# Patient Record
Sex: Female | Born: 1985 | Race: Black or African American | Hispanic: No | Marital: Single | Smoking: Former smoker
Health system: Southern US, Community
[De-identification: ages and names within clinical notes are randomized; demographics above are authoritative.]

## PROBLEM LIST (undated history)

## (undated) ENCOUNTER — Ambulatory Visit (HOSPITAL_COMMUNITY): Admission: EM | Discharge status: Absent without leave | Payer: Medicaid Other

## (undated) DIAGNOSIS — I10 Essential (primary) hypertension: Secondary | ICD-10-CM

## (undated) DIAGNOSIS — K8020 Calculus of gallbladder without cholecystitis without obstruction: Secondary | ICD-10-CM

## (undated) HISTORY — PX: DENTAL SURGERY: SHX609

---

## 2015-08-23 ENCOUNTER — Encounter (HOSPITAL_COMMUNITY): Payer: Self-pay

## 2015-08-23 ENCOUNTER — Emergency Department (HOSPITAL_COMMUNITY)
Admission: EM | Admit: 2015-08-23 | Discharge: 2015-08-23 | Disposition: A | Discharge status: Home / Self Care | Payer: Medicaid Other | Attending: Emergency Medicine | Admitting: Emergency Medicine

## 2015-08-23 ENCOUNTER — Emergency Department (HOSPITAL_COMMUNITY): Payer: Medicaid Other

## 2015-08-23 ENCOUNTER — Emergency Department (HOSPITAL_COMMUNITY)
Admission: EM | Admit: 2015-08-23 | Discharge: 2015-08-23 | Discharge status: Absent without leave | Payer: Medicaid Other

## 2015-08-23 DIAGNOSIS — N390 Urinary tract infection, site not specified: Secondary | ICD-10-CM | POA: Diagnosis not present

## 2015-08-23 DIAGNOSIS — I10 Essential (primary) hypertension: Secondary | ICD-10-CM | POA: Insufficient documentation

## 2015-08-23 DIAGNOSIS — R109 Unspecified abdominal pain: Secondary | ICD-10-CM | POA: Diagnosis present

## 2015-08-23 DIAGNOSIS — Z79899 Other long term (current) drug therapy: Secondary | ICD-10-CM | POA: Insufficient documentation

## 2015-08-23 DIAGNOSIS — F17210 Nicotine dependence, cigarettes, uncomplicated: Secondary | ICD-10-CM | POA: Diagnosis not present

## 2015-08-23 DIAGNOSIS — N159 Renal tubulo-interstitial disease, unspecified: Secondary | ICD-10-CM

## 2015-08-23 HISTORY — DX: Essential (primary) hypertension: I10

## 2015-08-23 LAB — URINALYSIS, ROUTINE W REFLEX MICROSCOPIC
Bilirubin Urine: NEGATIVE
GLUCOSE, UA: NEGATIVE mg/dL
Ketones, ur: NEGATIVE mg/dL
Nitrite: NEGATIVE
PROTEIN: NEGATIVE mg/dL
Specific Gravity, Urine: 1.018 (ref 1.005–1.030)
pH: 7.5 (ref 5.0–8.0)

## 2015-08-23 LAB — URINE MICROSCOPIC-ADD ON

## 2015-08-23 LAB — POC URINE PREG, ED: PREG TEST UR: NEGATIVE

## 2015-08-23 MED ORDER — CEPHALEXIN 500 MG PO CAPS: 1000.0000 mg | ORAL_CAPSULE | Freq: Three times a day (TID) | ORAL | Status: DC

## 2015-08-23 MED ORDER — HYDROCODONE-ACETAMINOPHEN 5-325 MG PO TABS: 1.0000 | ORAL_TABLET | Freq: Four times a day (QID) | ORAL | Status: DC | PRN

## 2015-08-23 MED ORDER — KETOROLAC TROMETHAMINE 60 MG/2ML IM SOLN
60.0000 mg | Freq: Once | INTRAMUSCULAR | Status: AC
  Administered 2015-08-23: 60 mg via INTRAMUSCULAR
  Filled 2015-08-23: qty 2

## 2015-08-23 MED ORDER — HYDROCODONE-ACETAMINOPHEN 5-325 MG PO TABS
2.0000 | ORAL_TABLET | Freq: Once | ORAL | Status: AC
  Administered 2015-08-23: 2 via ORAL
  Filled 2015-08-23: qty 2

## 2015-08-23 MED ORDER — CEPHALEXIN 250 MG PO CAPS
1000.0000 mg | ORAL_CAPSULE | Freq: Once | ORAL | Status: AC
  Administered 2015-08-23: 1000 mg via ORAL
  Filled 2015-08-23: qty 4

## 2015-08-23 MED ORDER — ONDANSETRON 4 MG PO TBDP
4.0000 mg | ORAL_TABLET | Freq: Once | ORAL | Status: AC
  Administered 2015-08-23: 4 mg via ORAL
  Filled 2015-08-23: qty 1

## 2015-08-23 NOTE — ED Provider Notes (Signed)
CSN: [id number]     Arrival date & time 08/23/15  1404 History   First MD Initiated Contact with Patient 08/23/15 1749     Chief Complaint  Patient presents with  . Flank Pain     (Consider location/radiation/quality/duration/timing/severity/associated sxs/prior Treatment) Patient is a 30 y.o. female presenting with flank pain.  Flank Pain This is a new problem. The current episode started more than 1 week ago. The problem occurs constantly. The problem has been gradually worsening. Pertinent negatives include no chest pain, no abdominal pain and no shortness of breath. Nothing aggravates the symptoms. Nothing relieves the symptoms. She has tried nothing for the symptoms.    Past Medical History  Diagnosis Date  . Hypertension    History reviewed. No pertinent past surgical history. No family history on file. Social History  Substance Use Topics  . Smoking status: Current Every Day Smoker    Types: Cigarettes  . Smokeless tobacco: None  . Alcohol Use: None   OB History    No data available     Review of Systems  Constitutional: Negative for chills.  Eyes: Negative for pain and itching.  Respiratory: Negative for shortness of breath.   Cardiovascular: Negative for chest pain.  Gastrointestinal: Negative for abdominal pain.  Genitourinary: Positive for dysuria and flank pain.  All other systems reviewed and are negative.     Allergies  Benadryl  Home Medications   Prior to Admission medications   Medication Sig Start Date End Date Taking? Authorizing Provider  cephALEXin (KEFLEX) 500 MG capsule Take 2 capsules (1,000 mg total) by mouth 3 (three) times daily. 08/23/15   Marily MemosJason Dazani Norby, MD  HYDROcodone-acetaminophen (NORCO/VICODIN) 5-325 MG tablet Take 1-2 tablets by mouth every 6 (six) hours as needed for moderate pain. 08/23/15   Marily MemosJason Cande Mastropietro, MD   BP 129/83 mmHg  Pulse 75  Temp(Src) 98.2 F (36.8 C) (Oral)  Resp 18  SpO2 98%  LMP 08/16/2015 Physical Exam   Constitutional: She is oriented to person, place, and time. She appears well-developed and well-nourished.  HENT:  Head: Normocephalic and atraumatic.  Neck: Normal range of motion.  Cardiovascular: Normal rate and regular rhythm.   Pulmonary/Chest: No stridor. No respiratory distress.  Abdominal: Soft. She exhibits no distension. There is tenderness (suprapubic). There is no rebound.  Musculoskeletal: Normal range of motion. She exhibits no edema or tenderness.  Neurological: She is alert and oriented to person, place, and time.  Skin: Skin is warm and dry.  Nursing note and vitals reviewed.   ED Course  Procedures (including critical care time) Labs Review Labs Reviewed  URINALYSIS, ROUTINE W REFLEX MICROSCOPIC (NOT [facility]) - Abnormal; Notable for the following:    APPearance CLOUDY (*)    Hgb urine dipstick MODERATE (*)    Leukocytes, UA SMALL (*)    All other components within normal limits  URINE MICROSCOPIC-ADD ON - Abnormal; Notable for the following:    Squamous Epithelial / LPF 0-5 (*)    Bacteria, UA MANY (*)    All other components within normal limits  POC URINE PREG, ED    Imaging Review Ct Renal Stone Study  08/23/2015  CLINICAL DATA:  Subacute onset of right flank pain, radiating to the right groin. Nausea and vomiting. Initial encounter. EXAM: CT ABDOMEN AND PELVIS WITHOUT CONTRAST TECHNIQUE: Multidetector CT imaging of the abdomen and pelvis was performed following the standard protocol without IV contrast. COMPARISON:  Report from CT of the abdomen and pelvis performed 02/05/2012 FINDINGS:  The visualized lung bases are clear. A 2.2 cm soft tissue nodule is noted anterior to the liver, demonstrating minimal calcification. Per correlation with report from 2013, this has increased gradually in size from 1.2 cm. Given its slow rate of growth, malignancy is less likely. This may arise from the liver. The liver and spleen are unremarkable in appearance. The gallbladder is  within normal limits. The pancreas and adrenal glands are unremarkable. The kidneys are unremarkable in appearance. There is no evidence of hydronephrosis. No renal or ureteral stones are seen. No perinephric stranding is appreciated. No free fluid is identified. The small bowel is unremarkable in appearance. The stomach is within normal limits. No acute vascular abnormalities are seen. The appendix is normal in caliber, without evidence of appendicitis. The colon is unremarkable in appearance. The bladder is mildly distended and grossly unremarkable. The uterus is unremarkable in appearance. The ovaries are relatively symmetric. No suspicious adnexal masses are seen. No inguinal lymphadenopathy is seen. No acute osseous abnormalities are identified. IMPRESSION: 1. No acute abnormality seen within the abdomen or pelvis. 2. 2.2 cm soft tissue nodule anterior to the liver demonstrates minimal calcification. This has increased gradually in size from 1.2 cm in 2013. Given its slow rate of growth, malignancy is less likely, though it cannot be excluded. This may arise from the liver. Would recommend follow-up CT of the abdomen and pelvis in 6 months, to assess interval change; alternatively, this could be followed by ultrasound, given its location. Electronically Signed   By: Roanna Raider M.D.   On: 08/23/2015 22:56   I have personally reviewed and evaluated these images and lab results as part of my medical decision-making.   EKG Interpretation None      MDM   Final diagnoses:  Kidney infection  UTI (lower urinary tract infection)    Uncomplicated pyelonephritis, will dc on abx, pain meds with PCP follow up. Return here for new/worsening symptoms.   New Prescriptions: Discharge Medication List as of 08/23/2015 11:02 PM    START taking these medications   Details  cephALEXin (KEFLEX) 500 MG capsule Take 2 capsules (1,000 mg total) by mouth 3 (three) times daily., Starting 08/23/2015, Until  Discontinued, Print    HYDROcodone-acetaminophen (NORCO/VICODIN) 5-325 MG tablet Take 1-2 tablets by mouth every 6 (six) hours as needed for moderate pain., Starting 08/23/2015, Until Discontinued, Print         I have personally and contemperaneously reviewed labs and imaging and used in my decision making as above.   A medical screening exam was performed and I feel the patient has had an appropriate workup for their chief complaint at this time and likelihood of emergent condition existing is low and thus workup can continue on an outpatient basis.. Their vital signs are stable. They have been counseled on decision, discharge, follow up and which symptoms necessitate immediate return to the emergency department.  They verbally stated understanding and agreement with plan and discharged in stable condition.      Marily Memos, MD 08/24/15 1739

## 2015-08-23 NOTE — ED Notes (Signed)
Pt stable, ambulatory, states understanding of discharge instructions 

## 2015-08-23 NOTE — ED Notes (Signed)
Called pt to reassess vitals, but pt did not answer. Informed Education officer, communityLori Charge RN.

## 2015-08-23 NOTE — ED Notes (Signed)
Called pt with no response 

## 2015-08-23 NOTE — ED Notes (Signed)
Pt returned to the ED after leaving, pt wishing to be checked again.

## 2015-08-23 NOTE — ED Notes (Addendum)
Patient here with 2 weeks of right flank pain with radiation to right groin. Pain with ambulation. Nausea with same. States that her urine looks dark

## 2015-09-03 ENCOUNTER — Encounter (HOSPITAL_COMMUNITY): Payer: Self-pay | Admitting: Emergency Medicine

## 2015-09-03 ENCOUNTER — Emergency Department (HOSPITAL_COMMUNITY)
Admission: EM | Admit: 2015-09-03 | Discharge: 2015-09-03 | Disposition: A | Discharge status: Home / Self Care | Payer: Medicaid Other | Attending: Emergency Medicine | Admitting: Emergency Medicine

## 2015-09-03 DIAGNOSIS — Y999 Unspecified external cause status: Secondary | ICD-10-CM | POA: Insufficient documentation

## 2015-09-03 DIAGNOSIS — F17210 Nicotine dependence, cigarettes, uncomplicated: Secondary | ICD-10-CM | POA: Diagnosis not present

## 2015-09-03 DIAGNOSIS — Y9389 Activity, other specified: Secondary | ICD-10-CM | POA: Insufficient documentation

## 2015-09-03 DIAGNOSIS — Y92410 Unspecified street and highway as the place of occurrence of the external cause: Secondary | ICD-10-CM | POA: Insufficient documentation

## 2015-09-03 DIAGNOSIS — M545 Low back pain, unspecified: Secondary | ICD-10-CM

## 2015-09-03 DIAGNOSIS — I10 Essential (primary) hypertension: Secondary | ICD-10-CM | POA: Insufficient documentation

## 2015-09-03 DIAGNOSIS — R42 Dizziness and giddiness: Secondary | ICD-10-CM | POA: Diagnosis not present
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
## 2015-12-04 ENCOUNTER — Ambulatory Visit
Admission: RE | Admit: 2015-12-04 | Discharge: 2015-12-04 | Disposition: A | Discharge status: Home / Self Care | Payer: Medicaid Other | Source: Ambulatory Visit | Attending: Orthopedic Surgery | Admitting: Orthopedic Surgery

## 2015-12-04 ENCOUNTER — Other Ambulatory Visit: Payer: Self-pay | Admitting: Orthopedic Surgery

## 2015-12-04 DIAGNOSIS — M545 Low back pain: Secondary | ICD-10-CM

## 2015-12-04 DIAGNOSIS — M542 Cervicalgia: Secondary | ICD-10-CM

## 2016-02-13 ENCOUNTER — Encounter (HOSPITAL_COMMUNITY): Payer: Self-pay | Admitting: *Deleted

## 2016-02-13 DIAGNOSIS — I10 Essential (primary) hypertension: Secondary | ICD-10-CM | POA: Insufficient documentation

## 2016-02-13 DIAGNOSIS — W1830XA Fall on same level, unspecified, initial encounter: Secondary | ICD-10-CM | POA: Diagnosis not present

## 2016-02-13 DIAGNOSIS — Y939 Activity, unspecified: Secondary | ICD-10-CM | POA: Insufficient documentation

## 2016-02-13 DIAGNOSIS — F17210 Nicotine dependence, cigarettes, uncomplicated: Secondary | ICD-10-CM | POA: Diagnosis not present

## 2016-02-13 DIAGNOSIS — Y999 Unspecified external cause status: Secondary | ICD-10-CM | POA: Insufficient documentation

## 2016-02-13 DIAGNOSIS — M25572 Pain in left ankle and joints of left foot: Secondary | ICD-10-CM | POA: Insufficient documentation

## 2016-02-13 DIAGNOSIS — Y929 Unspecified place or not applicable: Secondary | ICD-10-CM | POA: Diagnosis not present

## 2016-02-13 NOTE — ED Triage Notes (Signed)
The pt is c/o lt ankle pan.  She fell earlier today 1300 and twisted her abkle  lmp 11-10

## 2016-02-14 ENCOUNTER — Emergency Department (HOSPITAL_COMMUNITY): Payer: Medicaid Other

## 2016-02-14 ENCOUNTER — Emergency Department (HOSPITAL_COMMUNITY)
Admission: EM | Admit: 2016-02-14 | Discharge: 2016-02-14 | Disposition: A | Discharge status: Home / Self Care | Payer: Medicaid Other | Attending: Emergency Medicine | Admitting: Emergency Medicine

## 2016-02-14 DIAGNOSIS — M25572 Pain in left ankle and joints of left foot: Secondary | ICD-10-CM

## 2016-02-14 MED ORDER — IBUPROFEN 400 MG PO TABS
600.0000 mg | ORAL_TABLET | Freq: Once | ORAL | Status: AC
  Administered 2016-02-14: 600 mg via ORAL
  Filled 2016-02-14: qty 1

## 2016-02-14 NOTE — Discharge Instructions (Signed)
Read the information below.  Your x-ray does not show any obvious fracture or dislocation. You may have an ankle sprain. You are being provide crutches and an ankle brace. Ice and elevate for 20 minute increments for the next 2-3 days. Use the ankle brace and crutches for the next 3 days. Return to activity as tolerated. You can take tylenol or motrin for pain relief.  Be sure to follow up with your primary doctor in the next week if symptoms persist, if you do not have a regular doctor I have provided the contact information [facility], please call.  Use the prescribed medication as directed.  Please discuss all new medications with your pharmacist.   You may return to the Emergency Department at any time for worsening condition or any new symptoms that concern you.

## 2016-02-14 NOTE — ED Notes (Signed)
Patient transported to X-ray 

## 2016-02-14 NOTE — ED Provider Notes (Signed)
[facility]-EMERGENCY DEPT Provider Note   CSN: [id number] Arrival date & time: 02/13/16  2322     History   Chief Complaint Chief Complaint  Patient presents with  . Ankle Pain    HPI Nicole SpareShaquanna D Berti is a 30 y.o. female.  Nicole Hurley is a 30 y.o. female presents to ED with complaint of left ankle pain s/p fall. Patient reports she was talking with her daughter this afternoon when she lost her balance, fell, and twisted her left ankle. She complains of swelling and pain. Pain is constant, throbbing sensation. She took tylenol PTA with no relief. She denies fever, redness, warmth, discoloration, numbness, or weakness. She is able to walk; however, painful.       Past Medical History:  Diagnosis Date  . Hypertension     There are no active problems to display for this patient.   History reviewed. No pertinent surgical history.  OB History    No data available       Home Medications    Prior to Admission medications   Medication Sig Start Date End Date Taking? Authorizing Provider  cephALEXin (KEFLEX) 500 MG capsule Take 2 capsules (1,000 mg total) by mouth 3 (three) times daily. 08/23/15   Marily MemosJason Mesner, MD  HYDROcodone-acetaminophen (NORCO/VICODIN) 5-325 MG tablet Take 1-2 tablets by mouth every 6 (six) hours as needed for moderate pain. 08/23/15   Marily MemosJason Mesner, MD    Family History No family history on file.  Social History Social History  Substance Use Topics  . Smoking status: Current Every Day Smoker    Types: Cigarettes  . Smokeless tobacco: Never Used  . Alcohol use Yes     Allergies   Benadryl [diphenhydramine hcl (sleep)]   Review of Systems Review of Systems  Constitutional: Negative for fever.  Musculoskeletal: Positive for arthralgias and joint swelling.  Skin: Negative for color change.  Neurological: Negative for syncope, weakness, numbness and headaches.     Physical Exam Updated Vital Signs BP 148/94 (BP Location: Left Arm)    Pulse 85   Temp 97.8 F (36.6 C) (Oral)   Resp 18   Ht 5\' 5"  (1.651 m)   Wt (!) 180.5 kg   LMP 02/01/2016   SpO2 98%   BMI 66.23 kg/m   Physical Exam  Constitutional: She appears well-developed and well-nourished. No distress.  HENT:  Head: Normocephalic and atraumatic.  Eyes: Conjunctivae are normal. No scleral icterus.  Neck: Normal range of motion.  Pulmonary/Chest: Effort normal. No respiratory distress.  Abdominal: She exhibits no distension.  Musculoskeletal:       Left ankle: Tenderness.  Left ankle: Mild swelling of left ankle noted. No obvious deformity, erythema, warmth, redness, or discoloration. TTP of medial malleolus. ROM, strength, sensation intact. Capillary refill <3seconds. Pt is able to ambulate with limp.   Neurological: She is alert.  Skin: Skin is warm and dry. She is not diaphoretic.  Psychiatric: She has a normal mood and affect. Her behavior is normal.     ED Treatments / Results  Labs (all labs ordered are listed, but only abnormal results are displayed) Labs Reviewed - No data to display  EKG  EKG Interpretation None       Radiology Dg Ankle Complete Left  Result Date: 02/14/2016 CLINICAL DATA:  Medial pain and swelling of the left ankle after a fall today. EXAM: LEFT ANKLE COMPLETE - 3+ VIEW COMPARISON:  None. FINDINGS: Diffuse soft tissue swelling about the left ankle. Degenerative changes in  the left ankle with tibiotalar joint space narrowing and hypertrophic changes. Old appearing ununited ossicles under the medial malleolus. No evidence of acute fracture or dislocation. Plantar and Achilles calcaneal spurs. 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 Soft tissue swelling. No acute displaced fractures identified. Electronically Signed   By: Burman NievesWilliam  Stevens M.D.   On: 02/14/2016 01:23    Procedures Procedures (including critical care time)  Medications Ordered in ED Medications    ibuprofen (ADVIL,MOTRIN) tablet 600 mg (600 mg Oral Given 02/14/16 0115)     Initial Impression / Assessment and Plan / ED Course  I have reviewed the triage vital signs and the nursing notes.  Pertinent labs & imaging results that were available during my care of the patient were reviewed by me and considered in my medical decision making (see chart for details).  Clinical Course as of Feb 13 225  Thu Feb 14, 2016  0140 No obvious fracture or dislocation. Ossicles noted at medial malleolus.  DG Ankle Complete Left [AM]    Clinical Course User Index [AM] Lona KettleAshley Laurel Lary Eckardt, PA-C    Patient presents to ED with complaint of left ankle pain s/p fall. Patient is afebrile and non-toxic appearing in NAD. Vital signs remarkable for mildly elevated BP. No obvious deformity, discoloration, or warmth appreciated. Mild swelling with TTP of medial malleolus. Neurovascularly intact. Pt able to ambulate. X-ray negative for acute obvious fracture or dislocation. Suspect ankle sprain. Discussed results and plan with patient. ACE wrap provided. Pt declined crutches. Conservative therapy discussed to include RICE and tylenol/motrin for pain relief. Follow up with PCP if sxs persist. Return precautions provided. Pt voiced understanding and is agreeable.   Final Clinical Impressions(s) / ED Diagnoses   Final diagnoses:  Acute left ankle pain    New Prescriptions Discharge Medication List as of 02/14/2016  1:27 AM       Lona KettleAshley Laurel Halsey Hammen, PA-C 02/14/16 [id number]    Tomasita CrumbleAdeleke Oni, MD 02/14/16 1330

## 2017-02-13 ENCOUNTER — Emergency Department (HOSPITAL_COMMUNITY): Payer: Medicaid Other

## 2017-02-13 ENCOUNTER — Encounter (HOSPITAL_COMMUNITY): Payer: Self-pay | Admitting: Emergency Medicine

## 2017-02-13 ENCOUNTER — Emergency Department (HOSPITAL_COMMUNITY)
Admission: EM | Admit: 2017-02-13 | Discharge: 2017-02-13 | Disposition: A | Discharge status: Home / Self Care | Payer: Medicaid Other | Attending: Emergency Medicine | Admitting: Emergency Medicine

## 2017-02-13 ENCOUNTER — Other Ambulatory Visit: Payer: Self-pay

## 2017-02-13 DIAGNOSIS — F17210 Nicotine dependence, cigarettes, uncomplicated: Secondary | ICD-10-CM | POA: Diagnosis not present

## 2017-02-13 DIAGNOSIS — R1032 Left lower quadrant pain: Secondary | ICD-10-CM | POA: Diagnosis present

## 2017-02-13 DIAGNOSIS — I10 Essential (primary) hypertension: Secondary | ICD-10-CM | POA: Insufficient documentation

## 2017-02-13 LAB — CBC WITH DIFFERENTIAL/PLATELET
BASOS ABS: 0 10*3/uL (ref 0.0–0.1)
BASOS PCT: 0 %
Eosinophils Absolute: 0.3 10*3/uL (ref 0.0–0.7)
Eosinophils Relative: 2 %
HEMATOCRIT: 32.8 % — AB (ref 36.0–46.0)
Hemoglobin: 9.8 g/dL — ABNORMAL LOW (ref 12.0–15.0)
Lymphocytes Relative: 27 %
Lymphs Abs: 2.9 10*3/uL (ref 0.7–4.0)
MCH: 23.1 pg — ABNORMAL LOW (ref 26.0–34.0)
MCHC: 29.9 g/dL — ABNORMAL LOW (ref 30.0–36.0)
MCV: 77.2 fL — ABNORMAL LOW (ref 78.0–100.0)
Monocytes Absolute: 0.4 10*3/uL (ref 0.1–1.0)
Monocytes Relative: 4 %
NEUTROS ABS: 7.1 10*3/uL (ref 1.7–7.7)
Neutrophils Relative %: 67 %
Platelets: 384 10*3/uL (ref 150–400)
RBC: 4.25 MIL/uL (ref 3.87–5.11)
RDW: 19.5 % — ABNORMAL HIGH (ref 11.5–15.5)
WBC: 10.7 10*3/uL — ABNORMAL HIGH (ref 4.0–10.5)

## 2017-02-13 LAB — COMPREHENSIVE METABOLIC PANEL
ALBUMIN: 2.8 g/dL — AB (ref 3.5–5.0)
ALK PHOS: 90 U/L (ref 38–126)
ALT: 14 U/L (ref 14–54)
AST: 32 U/L (ref 15–41)
Anion gap: 8 (ref 5–15)
BILIRUBIN TOTAL: 0.4 mg/dL (ref 0.3–1.2)
BUN: 6 mg/dL (ref 6–20)
CO2: 26 mmol/L (ref 22–32)
CREATININE: 0.54 mg/dL (ref 0.44–1.00)
Calcium: 8.8 mg/dL — ABNORMAL LOW (ref 8.9–10.3)
Chloride: 104 mmol/L (ref 101–111)
GFR calc Af Amer: >60 mL/min (ref >60)
GFR calc non Af Amer: >60 mL/min (ref >60)
GLUCOSE: 86 mg/dL (ref 65–99)
POTASSIUM: 3.8 mmol/L (ref 3.5–5.1)
Sodium: 138 mmol/L (ref 135–145)
TOTAL PROTEIN: 7.9 g/dL (ref 6.5–8.1)

## 2017-02-13 LAB — URINALYSIS, ROUTINE W REFLEX MICROSCOPIC
Bilirubin Urine: NEGATIVE
GLUCOSE, UA: NEGATIVE mg/dL
HGB URINE DIPSTICK: NEGATIVE
Ketones, ur: NEGATIVE mg/dL
Leukocytes, UA: NEGATIVE
Nitrite: NEGATIVE
PROTEIN: NEGATIVE mg/dL
Specific Gravity, Urine: 1.023 (ref 1.005–1.030)
pH: 8 (ref 5.0–8.0)

## 2017-02-13 LAB — PREGNANCY, URINE: Preg Test, Ur: NEGATIVE

## 2017-02-13 MED ORDER — OXYCODONE-ACETAMINOPHEN 5-325 MG PO TABS
1.0000 | ORAL_TABLET | Freq: Once | ORAL | Status: AC
Start: 2017-02-13 — End: 2017-02-13
  Administered 2017-02-13: 1 via ORAL
  Filled 2017-02-13: qty 1

## 2017-02-13 MED ORDER — OXYCODONE-ACETAMINOPHEN 5-325 MG PO TABS
1.0000 | ORAL_TABLET | ORAL | Status: AC | PRN
  Administered 2017-02-13 (×2): 1 via ORAL
  Filled 2017-02-13 (×2): qty 1

## 2017-02-13 NOTE — ED Triage Notes (Signed)
Pt c/o left lower quad pain, pointing to lower pelvic area, started yesterday , denies vaginal discharge, normal periods.

## 2017-02-13 NOTE — ED Notes (Signed)
Pt verbalized understanding of d/c instructions and has no further questions. Pt is stable, A&Ox4, VSS.  

## 2017-02-13 NOTE — ED Notes (Signed)
Patient ambulated to restroom without assistance. No acute distress noted. 

## 2017-02-13 NOTE — ED Provider Notes (Signed)
I saw and evaluated the patient, reviewed the resident's note and I agree with the findings and plan.   EKG Interpretation None     31 year old female presents with 24-hour history of intermittent left lower quadrant abdominal pain without fever, vaginal bleeding, urinary symptoms.  On exam she is nontender.  She is currently pain-free.  Nothing makes her symptoms better or worse.  Will ultrasound for possible ovarian torsion and likely CT if negative   Lorre NickAllen, Gurney Balthazor, MD 02/13/17 1539

## 2017-02-13 NOTE — ED Notes (Signed)
Patient transported to CT 

## 2017-02-13 NOTE — ED Provider Notes (Signed)
[facility] EMERGENCY DEPARTMENT Provider Note   CSN: [id number] Arrival date & time: 02/13/17  1349     History   Chief Complaint Chief Complaint  Patient presents with  . Pelvic Pain    HPI Nicole Hurley is a 31 y.o. female.  The history is provided by the patient.  Abdominal Pain   This is a new problem. The current episode started yesterday. The problem occurs constantly. The problem has been gradually worsening. The pain is associated with an unknown factor. The pain is located in the LLQ. Quality: constant dull pain with a worsening intermittent sharp pain. The pain is severe. Pertinent negatives include anorexia, fever, diarrhea, nausea, vomiting, constipation, dysuria, frequency, hematuria and arthralgias. Nothing aggravates the symptoms. Nothing relieves the symptoms.  Denies vaginal bleeding or discharge and is not sexually active. Has had 2 cesarean sections but no other abdominal surgeries.  Past Medical History:  Diagnosis Date  . Hypertension     There are no active problems to display for this patient.   History reviewed. No pertinent surgical history.  OB History    No data available       Home Medications    Prior to Admission medications   Medication Sig Start Date End Date Taking? Authorizing Provider  acetaminophen (TYLENOL) 500 MG tablet Take 1,000 mg by mouth every 6 (six) hours as needed for moderate pain.   Yes [provider]    Family History No family history on file.  Social History Social History   Tobacco Use  . Smoking status: Current Every Day Smoker    Types: Cigarettes  . Smokeless tobacco: Never Used  Substance Use Topics  . Alcohol use: Yes  . Drug use: No     Allergies   Benadryl [diphenhydramine hcl (sleep)]   Review of Systems Review of Systems  Constitutional: Negative for chills and fever.  HENT: Negative for ear pain and sore throat.   Eyes: Negative for pain and visual  disturbance.  Respiratory: Negative for cough and shortness of breath.   Cardiovascular: Negative for chest pain and palpitations.  Gastrointestinal: Positive for abdominal pain. Negative for anorexia, constipation, diarrhea, nausea and vomiting.  Genitourinary: Negative for dysuria, flank pain, frequency, hematuria, vaginal bleeding, vaginal discharge and vaginal pain.  Musculoskeletal: Negative for arthralgias and back pain.  Skin: Negative for color change and rash.  Neurological: Negative for seizures and syncope.  All other systems reviewed and are negative.    Physical Exam Updated Vital Signs BP 124/86   Pulse 88   Temp 97.6 F (36.4 C) (Oral)   Resp 20   LMP 02/02/2017   SpO2 93%   Physical Exam  Constitutional: She appears well-developed and well-nourished. No distress.  Morbidly obese female.  HENT:  Head: Normocephalic and atraumatic.  Eyes: Conjunctivae are normal.  Neck: Neck supple.  Cardiovascular: Normal rate and regular rhythm.  No murmur heard. Pulmonary/Chest: Effort normal and breath sounds normal. No respiratory distress.  Abdominal: Soft. There is tenderness in the left lower quadrant. There is guarding. There is no rigidity, no rebound, no CVA tenderness, no tenderness at McBurney's point and negative Murphy's sign.  Musculoskeletal: She exhibits no edema.  Neurological: She is alert.  Skin: Skin is warm and dry.  Psychiatric: She has a normal mood and affect.  Nursing note and vitals reviewed.    ED Treatments / Results  Labs (all labs ordered are listed, but only abnormal results are displayed) Labs Reviewed  CBC WITH DIFFERENTIAL/PLATELET - Abnormal; Notable for the following components:      Result Value   WBC 10.7 (*)    Hemoglobin 9.8 (*)    HCT 32.8 (*)    MCV 77.2 (*)    MCH 23.1 (*)    MCHC 29.9 (*)    RDW 19.5 (*)    All other components within normal limits  COMPREHENSIVE METABOLIC PANEL - Abnormal; Notable for the following  components:   Calcium 8.8 (*)    Albumin 2.8 (*)    All other components within normal limits  URINALYSIS, ROUTINE W REFLEX MICROSCOPIC  PREGNANCY, URINE    EKG  EKG Interpretation None       Radiology Ct Abdomen Pelvis Wo Contrast  Result Date: 02/13/2017 CLINICAL DATA:  Left lower quadrant pain starting yesterday. EXAM: CT ABDOMEN AND PELVIS WITHOUT CONTRAST TECHNIQUE: Multidetector CT imaging of the abdomen and pelvis was performed following the standard protocol without IV contrast. COMPARISON:  None. FINDINGS: Lower chest: Top-normal size heart.  Clear lungs. Hepatobiliary: Anterior to the left hepatic lobe is a 2 cm short axis well-circumscribed solid nodule slightly smaller in appearance than on prior comparison study and reportedly dates back to 2013. Findings likely represent a benign finding though is nonspecific. Minimal calcification is seen along the posterior aspect. No biliary dilatation. The gallbladder is physiologically distended without calculus. Pancreas: Normal Spleen: Normal Adrenals/Urinary Tract: Adrenal glands are unremarkable. Kidneys are normal, without renal calculi, focal lesion, or hydronephrosis. Bladder is unremarkable. Stomach/Bowel: Nondistended stomach with normal small bowel rotation. No bowel obstruction or inflammation. Normal appearing appendix. Vascular/Lymphatic: No significant vascular findings are present. No enlarged abdominal or pelvic lymph nodes. Reproductive: Uterus and bilateral adnexa are unremarkable. Other: No abdominal wall hernia or abnormality. No abdominopelvic ascites. Mild diffuse subcutaneous fatty induration possibly representing mild anasarca. Musculoskeletal: Mild disc space narrowing L5-S1 with posterior marginal osteophytes. No acute nor suspicious osseous abnormalities. IMPRESSION: 1. Stable to slightly smaller ovoid solid appearing nodule anterior to the left hepatic lobe measuring 2 cm in short axis versus 2.2 cm previously. Given  its relative slow growth since 2013 from 1.2 cm reportedly, findings likely represent a benign finding though is nonspecific. 2. No acute bowel obstruction or inflammation. Electronically Signed   By: Tollie Ethavid  Kwon M.D.   On: 02/13/2017 19:23   [place] Pelvis Transvanginal Non-ob (tv Only)  Result Date: 02/13/2017 CLINICAL DATA:  Initial evaluation for acute left lower quadrant abdominal pain. EXAM: TRANSABDOMINAL AND TRANSVAGINAL ULTRASOUND OF PELVIS DOPPLER ULTRASOUND OF OVARIES TECHNIQUE: Both transabdominal and transvaginal ultrasound examinations of the pelvis were performed. Transabdominal technique was performed for global imaging of the pelvis including uterus, ovaries, adnexal regions, and pelvic cul-de-sac. It was necessary to proceed with endovaginal exam following the transabdominal exam to visualize the uterus and ovaries. Color and duplex Doppler ultrasound was utilized to evaluate blood flow to the ovaries. COMPARISON:  Prior CT from 08/23/2015 FINDINGS: Uterus Measurements: 9.3 x 6.0 x 4.5 cm. No fibroids or other mass visualized. Probable 1.8 x 0.2 x 0.7 cm nabothian cyst noted. Small amount of fluid noted within the endocervical canal. Endometrium Thickness: 9 mm.  No focal abnormality visualized. Right ovary Measurements: 3.1 x 2.4 x 2.3 cm. Normal appearance/no adnexal mass. Left ovary Measurements: 3.9 x 2.4 x 2.6 cm. Normal appearance/no adnexal mass. Pulsed Doppler evaluation of both ovaries demonstrates normal low-resistance arterial and venous waveforms. Other findings No abnormal free fluid. IMPRESSION: Normal pelvic ultrasound. No acute abnormality identified. No evidence  for torsion. Electronically Signed   By: Rise MuBenjamin  McClintock M.D.   On: 02/13/2017 17:20   [place] Pelvis (transabdominal Only)  Result Date: 02/13/2017 CLINICAL DATA:  Initial evaluation for acute left lower quadrant abdominal pain. EXAM: TRANSABDOMINAL AND TRANSVAGINAL ULTRASOUND OF PELVIS DOPPLER ULTRASOUND OF OVARIES  TECHNIQUE: Both transabdominal and transvaginal ultrasound examinations of the pelvis were performed. Transabdominal technique was performed for global imaging of the pelvis including uterus, ovaries, adnexal regions, and pelvic cul-de-sac. It was necessary to proceed with endovaginal exam following the transabdominal exam to visualize the uterus and ovaries. Color and duplex Doppler ultrasound was utilized to evaluate blood flow to the ovaries. COMPARISON:  Prior CT from 08/23/2015 FINDINGS: Uterus Measurements: 9.3 x 6.0 x 4.5 cm. No fibroids or other mass visualized. Probable 1.8 x 0.2 x 0.7 cm nabothian cyst noted. Small amount of fluid noted within the endocervical canal. Endometrium Thickness: 9 mm.  No focal abnormality visualized. Right ovary Measurements: 3.1 x 2.4 x 2.3 cm. Normal appearance/no adnexal mass. Left ovary Measurements: 3.9 x 2.4 x 2.6 cm. Normal appearance/no adnexal mass. Pulsed Doppler evaluation of both ovaries demonstrates normal low-resistance arterial and venous waveforms. Other findings No abnormal free fluid. IMPRESSION: Normal pelvic ultrasound. No acute abnormality identified. No evidence for torsion. Electronically Signed   By: Rise MuBenjamin  McClintock M.D.   On: 02/13/2017 17:20   [place] Pelvic Doppler (torsion R/o Or Mass Arterial Flow)  Result Date: 02/13/2017 CLINICAL DATA:  Initial evaluation for acute left lower quadrant abdominal pain. EXAM: TRANSABDOMINAL AND TRANSVAGINAL ULTRASOUND OF PELVIS DOPPLER ULTRASOUND OF OVARIES TECHNIQUE: Both transabdominal and transvaginal ultrasound examinations of the pelvis were performed. Transabdominal technique was performed for global imaging of the pelvis including uterus, ovaries, adnexal regions, and pelvic cul-de-sac. It was necessary to proceed with endovaginal exam following the transabdominal exam to visualize the uterus and ovaries. Color and duplex Doppler ultrasound was utilized to evaluate blood flow to the ovaries. COMPARISON:   Prior CT from 08/23/2015 FINDINGS: Uterus Measurements: 9.3 x 6.0 x 4.5 cm. No fibroids or other mass visualized. Probable 1.8 x 0.2 x 0.7 cm nabothian cyst noted. Small amount of fluid noted within the endocervical canal. Endometrium Thickness: 9 mm.  No focal abnormality visualized. Right ovary Measurements: 3.1 x 2.4 x 2.3 cm. Normal appearance/no adnexal mass. Left ovary Measurements: 3.9 x 2.4 x 2.6 cm. Normal appearance/no adnexal mass. Pulsed Doppler evaluation of both ovaries demonstrates normal low-resistance arterial and venous waveforms. Other findings No abnormal free fluid. IMPRESSION: Normal pelvic ultrasound. No acute abnormality identified. No evidence for torsion. Electronically Signed   By: Rise MuBenjamin  McClintock M.D.   On: 02/13/2017 17:20    Procedures Procedures (including critical care time)  Medications Ordered in ED Medications  oxyCODONE-acetaminophen (PERCOCET/ROXICET) 5-325 MG per tablet 1 tablet (1 tablet Oral Given 02/13/17 1621)  oxyCODONE-acetaminophen (PERCOCET/ROXICET) 5-325 MG per tablet 1 tablet (1 tablet Oral Given 02/13/17 2005)     Initial Impression / Assessment and Plan / ED Course  I have reviewed the triage vital signs and the nursing notes.  Pertinent labs & imaging results that were available during my care of the patient were reviewed by me and considered in my medical decision making (see chart for details).     31 year old female with the above history presenting with left lower quadrant colicky abdominal pain started yesterday and has been progressively worsening.  She is afebrile, normotensive and not tachycardic.  Exam is as above.  She has not had any vaginal bleeding or  discharge and is not sexually active.  Initial concern for ovarian torsion.  Pelvic ultrasound ordered.  Pregnancy test, CBC, CMP, ua ordered.  Percocet given for pain.  Pregnancy test negative.  CBC and CMP grossly unremarkable other than a hemoglobin of 9.8 with no prior labs  to compare to.  UA negative with no hematuria or signs of infection.  Denies hematemesis, hemoptysis, hematochezia.  Pelvic ultrasound negative with no signs of torsion or TOA.  CT abdomen pelvis without contrast ordered to rule out stone, obstruction or femoral hernia.  CT scan without evidence of obstruction, hernia, nephrolithiasis.  The patient is remained hemodynamic with stable and states that her pain has improved with the Percocet.  Patient informed to follow-up with the PCP on Monday for repeat labs and reevaluation.  Tylenol Motrin for pain.  Strict return precautions given patient is able to voice return precautions back.  She is amenable with this plan.  Discharged with her mother.  Final Clinical Impressions(s) / ED Diagnoses   Final diagnoses:  Colicky LLQ abdominal pain  Left lower quadrant pain    ED Discharge Orders    None       Alahna Dunne [place], MD 02/13/17 2026

## 2017-03-29 ENCOUNTER — Emergency Department (HOSPITAL_COMMUNITY): Payer: Medicaid Other

## 2017-03-29 ENCOUNTER — Encounter (HOSPITAL_COMMUNITY): Payer: Self-pay

## 2017-03-29 ENCOUNTER — Emergency Department (HOSPITAL_COMMUNITY)
Admission: EM | Admit: 2017-03-29 | Discharge: 2017-03-29 | Disposition: A | Discharge status: Home / Self Care | Payer: Medicaid Other | Attending: Emergency Medicine | Admitting: Emergency Medicine

## 2017-03-29 DIAGNOSIS — M25562 Pain in left knee: Secondary | ICD-10-CM

## 2017-03-29 DIAGNOSIS — M1712 Unilateral primary osteoarthritis, left knee: Secondary | ICD-10-CM | POA: Diagnosis not present

## 2017-03-29 DIAGNOSIS — F17210 Nicotine dependence, cigarettes, uncomplicated: Secondary | ICD-10-CM | POA: Insufficient documentation

## 2017-03-29 DIAGNOSIS — I10 Essential (primary) hypertension: Secondary | ICD-10-CM | POA: Insufficient documentation

## 2017-03-29 MED ORDER — DICLOFENAC SODIUM 1 % TD GEL: 4.0000 g | Freq: Four times a day (QID) | TRANSDERMAL | 0 refills | Status: DC

## 2017-03-29 MED ORDER — MELOXICAM 7.5 MG PO TABS: 7.5000 mg | ORAL_TABLET | Freq: Every day | ORAL | 0 refills | Status: DC

## 2017-03-29 MED ORDER — KETOROLAC TROMETHAMINE 60 MG/2ML IM SOLN
60.0000 mg | Freq: Once | INTRAMUSCULAR | Status: AC
  Administered 2017-03-29: 60 mg via INTRAMUSCULAR
  Filled 2017-03-29: qty 2

## 2017-03-29 NOTE — ED Provider Notes (Signed)
[name] EMERGENCY DEPARTMENT Provider Note   CSN: [id number] Arrival date & time: 03/29/17  0540     History   Chief Complaint Chief Complaint  Patient presents with  . Knee Pain    HPI Nicole Hurley is a 32 y.o. female.  32 year old female with morbid obesity and hypertension presenting today with left knee pain.  She states her knee has been hurting for the last 1 week and this progressively getting worse.  She is taking Tylenol and Motrin intermittently with some pain improvement.  She has noticed some swelling of her leg but denies any fever, redness or change in sensation in her feet.  She works a job where she is on her feet standing for 8-9 hours a day.  She recently started this job not too long ago but states her prior jobs have also been like that.  She denies any trauma or prior injury to that knee.  She has had no falls or twisting of her knee.  She is able to walk but limps because of the pain.  It is a dull throbbing pain and at rest is tolerable but when walking is 8 out of 10   The history is provided by the patient.    Past Medical History:  Diagnosis Date  . Hypertension     There are no active problems to display for this patient.   History reviewed. No pertinent surgical history.  OB History    No data available       Home Medications    Prior to Admission medications   Medication Sig Start Date End Date Taking? Authorizing Provider  acetaminophen (TYLENOL) 500 MG tablet Take 1,000 mg by mouth every 6 (six) hours as needed for moderate pain.    [provider]    Family History No family history on file.  Social History Social History   Tobacco Use  . Smoking status: Current Every Day Smoker    Types: Cigarettes  . Smokeless tobacco: Never Used  Substance Use Topics  . Alcohol use: Yes  . Drug use: No     Allergies   Benadryl [diphenhydramine hcl (sleep)]   Review of Systems Review of Systems    All other systems reviewed and are negative.    Physical Exam Updated Vital Signs BP 138/81 (BP Location: Right Arm)   Pulse 81   Temp 98.3 F (36.8 C) (Oral)   Resp 18   SpO2 100%   Physical Exam  Constitutional: She is oriented to person, place, and time. She appears well-developed and well-nourished. No distress.  HENT:  Head: Normocephalic and atraumatic.  Eyes: EOM are normal. Pupils are equal, round, and reactive to light.  Cardiovascular: Normal rate.  Pulmonary/Chest: Effort normal.  Musculoskeletal: She exhibits tenderness.       Left knee: She exhibits normal range of motion. Tenderness found. Medial joint line and lateral joint line tenderness noted.       Legs: Neurological: She is alert and oriented to person, place, and time.  Psychiatric: She has a normal mood and affect. Her behavior is normal.  Nursing note and vitals reviewed.    ED Treatments / Results  Labs (all labs ordered are listed, but only abnormal results are displayed) Labs Reviewed - No data to display  EKG  EKG Interpretation None       Radiology Dg Knee Complete 4 Views Left  Result Date: 03/29/2017 CLINICAL DATA:  Left knee pain for 1 week.  No known injury. EXAM: LEFT KNEE - COMPLETE 4+ VIEW COMPARISON:  None. FINDINGS: No evidence of fracture, dislocation, or joint effusion. Mild medial tibiofemoral and patellar spurring. Quadriceps and patellar tendon enthesophytes. Soft tissues are unremarkable. IMPRESSION: Mild osteoarthritis without acute abnormality. Electronically Signed   By: Rubye OaksMelanie  Ehinger M.D.   On: 03/29/2017 06:14    Procedures Procedures (including critical care time)  Medications Ordered in ED Medications  ketorolac (TORADOL) injection 60 mg (not administered)     Initial Impression / Assessment and Plan / ED Course  I have reviewed the triage vital signs and the nursing notes.  Pertinent labs & imaging results that were available during my care of the  patient were reviewed by me and considered in my medical decision making (see chart for details).     Patient is a 32 year old female presenting with knee pain.  X-ray shows mild osteoarthritis without other acute abnormality.  Patient describes symptoms of most likely small joint effusion and flare of arthritis.  Patient's symptoms are most likely related to her morbid obesity and the fact that she stands for long periods of time at work.  Low suspicion for septic joint at this time.  She is neurovascularly intact.  Patient started on meloxicam and told to continue Tylenol as needed.  Given follow-up with orthopedics.  Final Clinical Impressions(s) / ED Diagnoses   Final diagnoses:  Acute pain of left knee  Primary osteoarthritis of left knee    ED Discharge Orders        Ordered    meloxicam (MOBIC) 7.5 MG tablet  Daily     03/29/17 0832    diclofenac sodium (VOLTAREN) 1 % GEL  4 times daily     03/29/17 [id number]       Gwyneth SproutPlunkett, Jay Haskew, MD 03/29/17 [phone]

## 2017-03-29 NOTE — ED Triage Notes (Signed)
Pt states that for the past week her L knee has been swollen and hurting, denies injury, pt ambulatory

## 2017-05-12 ENCOUNTER — Other Ambulatory Visit: Payer: Self-pay | Admitting: Student

## 2017-05-12 DIAGNOSIS — S83207A Unspecified tear of unspecified meniscus, current injury, left knee, initial encounter: Secondary | ICD-10-CM

## 2017-05-25 ENCOUNTER — Ambulatory Visit
Admission: RE | Admit: 2017-05-25 | Discharge: 2017-05-25 | Disposition: A | Discharge status: Home / Self Care | Payer: Medicaid Other | Source: Ambulatory Visit | Attending: Student | Admitting: Student

## 2017-05-25 DIAGNOSIS — S83207A Unspecified tear of unspecified meniscus, current injury, left knee, initial encounter: Secondary | ICD-10-CM

## 2017-06-22 ENCOUNTER — Other Ambulatory Visit: Payer: Self-pay | Admitting: Student

## 2017-06-26 ENCOUNTER — Other Ambulatory Visit: Payer: Self-pay | Admitting: Student

## 2017-06-26 DIAGNOSIS — M25562 Pain in left knee: Secondary | ICD-10-CM

## 2017-07-06 ENCOUNTER — Inpatient Hospital Stay
Admission: RE | Admit: 2017-07-06 | Discharge: 2017-07-06 | Disposition: A | Discharge status: Home / Self Care | Payer: Medicaid Other | Source: Ambulatory Visit | Attending: Student | Admitting: Student

## 2017-09-21 DIAGNOSIS — K8020 Calculus of gallbladder without cholecystitis without obstruction: Secondary | ICD-10-CM

## 2017-09-21 HISTORY — DX: Calculus of gallbladder without cholecystitis without obstruction: K80.20

## 2017-10-18 ENCOUNTER — Emergency Department (HOSPITAL_COMMUNITY)
Admission: EM | Admit: 2017-10-18 | Discharge: 2017-10-19 | Disposition: A | Discharge status: Home / Self Care | Payer: Medicaid Other | Attending: Emergency Medicine | Admitting: Emergency Medicine

## 2017-10-18 ENCOUNTER — Encounter (HOSPITAL_COMMUNITY): Payer: Self-pay

## 2017-10-18 DIAGNOSIS — R1013 Epigastric pain: Secondary | ICD-10-CM | POA: Diagnosis present

## 2017-10-18 DIAGNOSIS — R1011 Right upper quadrant pain: Secondary | ICD-10-CM | POA: Insufficient documentation

## 2017-10-18 DIAGNOSIS — Z79899 Other long term (current) drug therapy: Secondary | ICD-10-CM | POA: Insufficient documentation

## 2017-10-18 DIAGNOSIS — I10 Essential (primary) hypertension: Secondary | ICD-10-CM | POA: Insufficient documentation

## 2017-10-18 DIAGNOSIS — K8050 Calculus of bile duct without cholangitis or cholecystitis without obstruction: Secondary | ICD-10-CM | POA: Insufficient documentation

## 2017-10-18 DIAGNOSIS — F17210 Nicotine dependence, cigarettes, uncomplicated: Secondary | ICD-10-CM | POA: Insufficient documentation

## 2017-10-18 LAB — URINALYSIS, ROUTINE W REFLEX MICROSCOPIC
BILIRUBIN URINE: NEGATIVE
Glucose, UA: NEGATIVE mg/dL
HGB URINE DIPSTICK: NEGATIVE
KETONES UR: NEGATIVE mg/dL
Leukocytes, UA: NEGATIVE
NITRITE: NEGATIVE
PH: 8 (ref 5.0–8.0)
Protein, ur: NEGATIVE mg/dL
SPECIFIC GRAVITY, URINE: 1.01 (ref 1.005–1.030)

## 2017-10-18 LAB — I-STAT BETA HCG BLOOD, ED (MC, WL, AP ONLY)

## 2017-10-18 LAB — COMPREHENSIVE METABOLIC PANEL
ALBUMIN: 3 g/dL — AB (ref 3.5–5.0)
ALK PHOS: 86 U/L (ref 38–126)
ALT: 16 U/L (ref 0–44)
AST: 15 U/L (ref 15–41)
Anion gap: 9 (ref 5–15)
BILIRUBIN TOTAL: 0.2 mg/dL — AB (ref 0.3–1.2)
BUN: 8 mg/dL (ref 6–20)
CALCIUM: 8.8 mg/dL — AB (ref 8.9–10.3)
CO2: 27 mmol/L (ref 22–32)
Chloride: 101 mmol/L (ref 98–111)
Creatinine, Ser: 0.58 mg/dL (ref 0.44–1.00)
GFR calc Af Amer: >60 mL/min (ref >60)
GFR calc non Af Amer: >60 mL/min (ref >60)
Glucose, Bld: 107 mg/dL — ABNORMAL HIGH (ref 70–99)
Potassium: 3.6 mmol/L (ref 3.5–5.1)
SODIUM: 137 mmol/L (ref 135–145)
TOTAL PROTEIN: 8.1 g/dL (ref 6.5–8.1)

## 2017-10-18 LAB — CBC
HCT: 33.7 % — ABNORMAL LOW (ref 36.0–46.0)
Hemoglobin: 9.6 g/dL — ABNORMAL LOW (ref 12.0–15.0)
MCH: 22.2 pg — AB (ref 26.0–34.0)
MCHC: 28.5 g/dL — ABNORMAL LOW (ref 30.0–36.0)
MCV: 78 fL (ref 78.0–100.0)
Platelets: 419 10*3/uL — ABNORMAL HIGH (ref 150–400)
RBC: 4.32 MIL/uL (ref 3.87–5.11)
RDW: 19.2 % — AB (ref 11.5–15.5)
WBC: 10.7 10*3/uL — ABNORMAL HIGH (ref 4.0–10.5)

## 2017-10-18 LAB — LIPASE, BLOOD: Lipase: 31 U/L (ref 11–51)

## 2017-10-18 NOTE — ED Triage Notes (Signed)
Pt states that she has had upper abd pain since yesterday with diarrhea x 3, denies n/v/fevers.

## 2017-10-19 ENCOUNTER — Emergency Department (HOSPITAL_COMMUNITY): Payer: Medicaid Other

## 2017-10-19 MED ORDER — HYDROCODONE-ACETAMINOPHEN 5-325 MG PO TABS: 1.0000 | ORAL_TABLET | Freq: Four times a day (QID) | ORAL | 0 refills | Status: DC | PRN

## 2017-10-19 MED ORDER — DICYCLOMINE HCL 10 MG/ML IM SOLN
20.0000 mg | Freq: Once | INTRAMUSCULAR | Status: AC
  Administered 2017-10-19: 20 mg via INTRAMUSCULAR
  Filled 2017-10-19: qty 2

## 2017-10-19 MED ORDER — DICYCLOMINE HCL 20 MG PO TABS: 20.0000 mg | ORAL_TABLET | Freq: Two times a day (BID) | ORAL | 0 refills | Status: DC | PRN

## 2017-10-19 MED ORDER — GI COCKTAIL ~~LOC~~
30.0000 mL | Freq: Once | ORAL | Status: AC
  Administered 2017-10-19: 30 mL via ORAL
  Filled 2017-10-19: qty 30

## 2017-10-19 NOTE — Discharge Instructions (Signed)
Your found to have gallstones in your gallbladder.  This is likely the cause of your pain today.  We recommend follow-up with a surgeon to discuss elective cholecystectomy (gallbladder removal).  You have been prescribed Bentyl to take for abdominal pain.  Should you experience severe pain, you may take Norco as needed.  Do not drive or drink alcohol after taking Norco as it may make you drowsy and impair your judgment.  Follow-up with your primary care doctor in the interim for ongoing management.  You may return for new or concerning symptoms.

## 2017-10-19 NOTE — ED Notes (Signed)
PT states understanding of care given, follow up care, and medication prescribed. PT ambulated from ED to car with a steady gait. 

## 2017-10-19 NOTE — ED Provider Notes (Signed)
[facility]   CSN: [id number] Arrival date & time: 10/18/17  2236    History   Chief Complaint Chief Complaint  Patient presents with  . Abdominal Pain    HPI Nicole Hurley is a 32 y.o. female.  32 year old female with no significant past medical history presents to the emergency department for complaints of abdominal pain.  She reports onset of a bloating sensation in her upper abdomen yesterday with associated discomfort in her epigastrium.  She took Tums and gas tablets for symptoms without relief.  She has not had any nausea or vomiting.  She does note anorexia as eating seems to cause increasing discomfort.  She had diarrhea x3, but states that this often occurs after eating sunflower seeds and she had some of these yesterday as well.  She has not had any fevers, urinary symptoms.  Abdominal surgical history significant for cesarean section x2.  The history is provided by the patient. No language interpreter was used.  Abdominal Pain      Past Medical History:  Diagnosis Date  . Hypertension     There are no active problems to display for this patient.   History reviewed. No pertinent surgical history.   OB History   None      Home Medications    Prior to Admission medications   Medication Sig Start Date End Date Taking? Authorizing Provider  acetaminophen (TYLENOL) 500 MG tablet Take 1,000 mg by mouth every 6 (six) hours as needed for moderate pain.    [provider]  amLODipine (NORVASC) 5 MG tablet Take by mouth.    [provider]  diclofenac sodium (VOLTAREN) 1 % GEL Apply 4 g topically 4 (four) times daily. 03/29/17   Gwyneth SproutPlunkett, Whitney, MD  dicyclomine (BENTYL) 20 MG tablet Take 1 tablet (20 mg total) by mouth every 12 (twelve) hours as needed for spasms. Use for abdominal pain/cramping, as prescribed. 10/19/17   Antony MaduraHumes, Rhoderick Farrel, PA-C  HYDROcodone-acetaminophen (NORCO/VICODIN) 5-325 MG  tablet Take 1 tablet by mouth every 6 (six) hours as needed for severe pain. 10/19/17   Antony MaduraHumes, Raymund Manrique, PA-C  meloxicam (MOBIC) 7.5 MG tablet Take 1 tablet (7.5 mg total) by mouth daily. 03/29/17   Gwyneth SproutPlunkett, Whitney, MD    Family History No family history on file.  Social History Social History   Tobacco Use  . Smoking status: Current Every Day Smoker    Types: Cigarettes  . Smokeless tobacco: Never Used  Substance Use Topics  . Alcohol use: Yes  . Drug use: No     Allergies   Benadryl [diphenhydramine hcl (sleep)]   Review of Systems Review of Systems  Gastrointestinal: Positive for abdominal pain.  Ten systems reviewed and are negative for acute change, except as noted in the HPI.    Physical Exam Updated Vital Signs BP (!) 145/85   Pulse 82   Temp 98.7 F (37.1 C) (Oral)   Resp (!) 22   SpO2 99%   Physical Exam  Constitutional: She is oriented to person, place, and time. She appears well-developed and well-nourished. No distress.  Nontoxic appearing. Morbidly obese.  HENT:  Head: Normocephalic and atraumatic.  Eyes: Conjunctivae and EOM are normal. No scleral icterus.  Neck: Normal range of motion.  Cardiovascular: Normal rate, regular rhythm and intact distal pulses.  Pulmonary/Chest: Effort normal. No stridor. No respiratory distress. She has no wheezes.  Lungs CTAB. Respirations even and unlabored.  Abdominal: Soft. She exhibits no mass.  There is tenderness. There is no guarding.  Epigastric and RUQ TTP. No guarding or peritoneal signs. Exam limited 2/2 body habitus.  Musculoskeletal: Normal range of motion.  Neurological: She is alert and oriented to person, place, and time. She exhibits normal muscle tone. Coordination normal.  Skin: Skin is warm and dry. No rash noted. She is not diaphoretic. No erythema. No pallor.  Psychiatric: She has a normal mood and affect. Her behavior is normal.  Nursing note and vitals reviewed.    ED Treatments / Results   Labs (all labs ordered are listed, but only abnormal results are displayed) Labs Reviewed  COMPREHENSIVE METABOLIC PANEL - Abnormal; Notable for the following components:      Result Value   Glucose, Bld 107 (*)    Calcium 8.8 (*)    Albumin 3.0 (*)    Total Bilirubin 0.2 (*)    All other components within normal limits  CBC - Abnormal; Notable for the following components:   WBC 10.7 (*)    Hemoglobin 9.6 (*)    HCT 33.7 (*)    MCH 22.2 (*)    MCHC 28.5 (*)    RDW 19.2 (*)    Platelets 419 (*)    All other components within normal limits  LIPASE, BLOOD  URINALYSIS, ROUTINE W REFLEX MICROSCOPIC  I-STAT BETA HCG BLOOD, ED ([facility], [facility], [facility] ONLY)    EKG None  Radiology US Abdomen Complete  Result Date: 10/19/2017 CLINICAL DATA:  Right upper quadrant and epigastric pain. EXAM: ABDOMEN ULTRASOUND COMPLETE COMPARISON:  None. FINDINGS: Technically limited exam due to patient body habitus. Gallbladder: Physiologically distended containing small gallstones. No gallbladder wall thickening or pericholecystic fluid. No sonographic Murphy sign noted by sonographer. Common bile duct: Diameter: 2-4 mm, normal. Liver: Diffusely increased in parenchymal echogenicity. The previous exophytic hepatic nodule on CT is not seen sonographically. Portal vein is patent on color Doppler imaging with normal direction of blood flow towards the liver. IVC: Not well visualized. Pancreas: Not well visualized. Spleen: Normal in size but technically limited. Right Kidney: Length: 12.1 cm. Echogenicity within normal limits. No mass or hydronephrosis visualized. Left Kidney: Length: 11.8 cm. Echogenicity within normal limits. No mass or hydronephrosis visualized. Abdominal aorta: No aneurysm visualized. Portions obscured by bowel gas. Other findings: None.  No ascites. IMPRESSION: 1. Gallstones without sonographic findings of acute cholecystitis. 2. Hepatic steatosis. 3. Midline structures including the pancreas, IVC, and  portions of the abdominal aorta are obscured and not well visualized. Electronically Signed   By: Rubye Oaks M.D.   On: 10/19/2017 02:16    Procedures Procedures (including critical care time)  Medications Ordered in ED Medications  dicyclomine (BENTYL) injection 20 mg (20 mg Intramuscular Given 10/19/17 0147)  gi cocktail (Maalox,Lidocaine,Donnatal) (30 mLs Oral Given 10/19/17 0143)     Initial Impression / Assessment and Plan / ED Course  I have reviewed the triage vital signs and the nursing notes.  Pertinent labs & imaging results that were available during my care of the patient were reviewed by me and considered in my medical decision making (see chart for details).     32 year old female presents to the emergency department for upper abdominal pain which began yesterday.  She has had some improvement in the ED following Bentyl and a GI cocktail.  No vomiting or significant bowel changes.  She is afebrile with stable vital signs.  Laboratory work-up reassuring with mild leukocytosis and anemia at baseline.  Ultrasound obtained given right upper quadrant  tenderness.  This shows gallstones without pericholecystic fluid or gallbladder wall thickening.  Negative Murphy sign today.  Low suspicion for acute cholecystitis.  No present concern for choledocholithiasis, especially given reassuring LFTs.  Given symptomatic improvement and hemodynamic stability, patient referred to general surgery for outpatient follow-up to discuss elective cholecystectomy.  Patient prescribed Bentyl to take for persistent discomfort.  Short course of Norco given for severe pain.  Return precautions discussed and provided. Patient discharged in stable condition with no unaddressed concerns.   Final Clinical Impressions(s) / ED Diagnoses   Final diagnoses:  Biliary colic    ED Discharge Orders        Ordered    dicyclomine (BENTYL) 20 MG tablet  Every 12 hours PRN     10/19/17 0244     HYDROcodone-acetaminophen (NORCO/VICODIN) 5-325 MG tablet  Every 6 hours PRN     10/19/17 0244       Antony Madura, PA-C 10/19/17 0343    Dione Booze, MD 10/19/17 [id number]

## 2017-10-20 ENCOUNTER — Inpatient Hospital Stay (HOSPITAL_COMMUNITY)
Admission: EM | Admit: 2017-10-20 | Discharge: 2017-10-23 | DRG: 418 | Disposition: A | Discharge status: Home / Self Care | Payer: Medicaid Other | Attending: Surgery | Admitting: Surgery

## 2017-10-20 ENCOUNTER — Encounter (HOSPITAL_COMMUNITY): Payer: Self-pay

## 2017-10-20 ENCOUNTER — Other Ambulatory Visit: Payer: Self-pay

## 2017-10-20 DIAGNOSIS — I10 Essential (primary) hypertension: Secondary | ICD-10-CM | POA: Diagnosis present

## 2017-10-20 DIAGNOSIS — Z79899 Other long term (current) drug therapy: Secondary | ICD-10-CM

## 2017-10-20 DIAGNOSIS — Z79891 Long term (current) use of opiate analgesic: Secondary | ICD-10-CM

## 2017-10-20 DIAGNOSIS — K8000 Calculus of gallbladder with acute cholecystitis without obstruction: Principal | ICD-10-CM | POA: Diagnosis present

## 2017-10-20 DIAGNOSIS — K8050 Calculus of bile duct without cholangitis or cholecystitis without obstruction: Secondary | ICD-10-CM

## 2017-10-20 DIAGNOSIS — R7303 Prediabetes: Secondary | ICD-10-CM | POA: Diagnosis present

## 2017-10-20 DIAGNOSIS — K760 Fatty (change of) liver, not elsewhere classified: Secondary | ICD-10-CM | POA: Diagnosis present

## 2017-10-20 DIAGNOSIS — Z791 Long term (current) use of non-steroidal anti-inflammatories (NSAID): Secondary | ICD-10-CM

## 2017-10-20 DIAGNOSIS — Z6841 Body Mass Index (BMI) 40.0 and over, adult: Secondary | ICD-10-CM

## 2017-10-20 DIAGNOSIS — Z888 Allergy status to other drugs, medicaments and biological substances status: Secondary | ICD-10-CM

## 2017-10-20 DIAGNOSIS — Z7984 Long term (current) use of oral hypoglycemic drugs: Secondary | ICD-10-CM

## 2017-10-20 DIAGNOSIS — F17210 Nicotine dependence, cigarettes, uncomplicated: Secondary | ICD-10-CM | POA: Diagnosis present

## 2017-10-20 DIAGNOSIS — D649 Anemia, unspecified: Secondary | ICD-10-CM | POA: Diagnosis present

## 2017-10-20 DIAGNOSIS — K819 Cholecystitis, unspecified: Secondary | ICD-10-CM | POA: Diagnosis present

## 2017-10-20 HISTORY — DX: Calculus of gallbladder without cholecystitis without obstruction: K80.20

## 2017-10-20 LAB — COMPREHENSIVE METABOLIC PANEL
ALT: 33 U/L (ref 0–44)
ANION GAP: 9 (ref 5–15)
AST: 31 U/L (ref 15–41)
Albumin: 3.1 g/dL — ABNORMAL LOW (ref 3.5–5.0)
Alkaline Phosphatase: 96 U/L (ref 38–126)
BUN: 7 mg/dL (ref 6–20)
CHLORIDE: 102 mmol/L (ref 98–111)
CO2: 26 mmol/L (ref 22–32)
CREATININE: 0.58 mg/dL (ref 0.44–1.00)
Calcium: 9.2 mg/dL (ref 8.9–10.3)
GFR calc Af Amer: >60 mL/min (ref >60)
Glucose, Bld: 92 mg/dL (ref 70–99)
POTASSIUM: 3.8 mmol/L (ref 3.5–5.1)
SODIUM: 137 mmol/L (ref 135–145)
Total Bilirubin: 0.5 mg/dL (ref 0.3–1.2)
Total Protein: 8.7 g/dL — ABNORMAL HIGH (ref 6.5–8.1)

## 2017-10-20 LAB — CBC WITH DIFFERENTIAL/PLATELET
ABS IMMATURE GRANULOCYTES: 0 10*3/uL (ref 0.0–0.1)
BASOS ABS: 0 10*3/uL (ref 0.0–0.1)
BASOS PCT: 0 %
EOS ABS: 0.5 10*3/uL (ref 0.0–0.7)
EOS PCT: 4 %
HCT: 35.8 % — ABNORMAL LOW (ref 36.0–46.0)
Hemoglobin: 10.1 g/dL — ABNORMAL LOW (ref 12.0–15.0)
Immature Granulocytes: 0 %
Lymphocytes Relative: 25 %
Lymphs Abs: 2.7 10*3/uL (ref 0.7–4.0)
MCH: 22 pg — ABNORMAL LOW (ref 26.0–34.0)
MCHC: 28.2 g/dL — AB (ref 30.0–36.0)
MCV: 78 fL (ref 78.0–100.0)
MONO ABS: 0.4 10*3/uL (ref 0.1–1.0)
MONOS PCT: 4 %
NEUTROS ABS: 7 10*3/uL (ref 1.7–7.7)
Neutrophils Relative %: 67 %
Platelets: 469 10*3/uL — ABNORMAL HIGH (ref 150–400)
RBC: 4.59 MIL/uL (ref 3.87–5.11)
RDW: 19.5 % — AB (ref 11.5–15.5)
WBC: 10.6 10*3/uL — ABNORMAL HIGH (ref 4.0–10.5)

## 2017-10-20 LAB — LIPASE, BLOOD: LIPASE: 27 U/L (ref 11–51)

## 2017-10-20 MED ORDER — HYDROMORPHONE HCL 1 MG/ML IJ SOLN
1.0000 mg | Freq: Once | INTRAMUSCULAR | Status: AC
  Administered 2017-10-21: 1 mg via INTRAVENOUS
  Filled 2017-10-20: qty 1

## 2017-10-20 MED ORDER — OXYCODONE-ACETAMINOPHEN 5-325 MG PO TABS
1.0000 | ORAL_TABLET | Freq: Once | ORAL | Status: AC
  Administered 2017-10-20: 1 via ORAL
  Filled 2017-10-20: qty 1

## 2017-10-20 MED ORDER — ONDANSETRON HCL 4 MG/2ML IJ SOLN
4.0000 mg | Freq: Once | INTRAMUSCULAR | Status: AC
  Administered 2017-10-21: 4 mg via INTRAVENOUS
  Filled 2017-10-20: qty 2

## 2017-10-20 MED ORDER — ONDANSETRON 4 MG PO TBDP
4.0000 mg | ORAL_TABLET | Freq: Once | ORAL | Status: AC
Start: 2017-10-20 — End: 2017-10-20
  Administered 2017-10-20: 4 mg via ORAL
  Filled 2017-10-20: qty 1

## 2017-10-20 MED ORDER — SODIUM CHLORIDE 0.9 % IV BOLUS
500.0000 mL | Freq: Once | INTRAVENOUS | Status: AC
  Administered 2017-10-21: 500 mL via INTRAVENOUS

## 2017-10-20 NOTE — ED Triage Notes (Signed)
Pt presents with worsening abdominal pain since she was here and discharged x 2 days ago with gallstones.  Pt reports she has begun to have vomiting and diarrhea.  Appointment with surgeon 8/7.

## 2017-10-20 NOTE — ED Provider Notes (Signed)
Patient placed in Quick Look pathway, seen and evaluated   Chief Complaint: abd pain, n/v/d  HPI:   Pt states her sxs began 2 days ago. She was seen in the ED, diagnosed with gallstones. She has f/u apt with gen surgery on 8/7. She has been taking bentyl and norco as prescribed, but reports continued severe pain and persistent post-prandial vomiting., she denies fevers or chill. No cp or sob. Pain is in the epigastric and RUQ. No lower abd pain. H/o prediabetes and pre-htn, on metformin and amlodipine.   ROS:   Physical Exam:   Gen: No distress  Neuro: Awake and Alert  Skin: Warm    Focused Exam: ttp of ruq and epigastric abd. negative murphys. No ttp elsewhere in the abd.    Initiation of care has begun. The patient has been counseled on the process, plan, and necessity for staying for the completion/evaluation, and the remainder of the medical screening examination    Alveria ApleyCaccavale, Janeisha Ryle, PA-C 10/20/17 1931    Terrilee FilesButler, Michael C, MD 10/21/17 1208

## 2017-10-20 NOTE — ED Notes (Signed)
ED Provider at bedside. 

## 2017-10-20 NOTE — ED Provider Notes (Signed)
[facility] EMERGENCY DEPARTMENT Provider Note   CSN: [id number] Arrival date & time: 10/20/17  1832     History   Chief Complaint Chief Complaint  Patient presents with  . Abdominal Pain    HPI Nicole Hurley is a 32 y.o. female.  Patient presents to the emergency department for evaluation of abdominal pain.  Patient was seen in the ER 2 days ago with similar and diagnosed with gallstones.  She reports that she has had persistent pain since leaving the ER.  Pain is constant in the right upper quadrant.  She has nausea and vomiting.  She reports that pain significantly worsens whenever she tries to eat.  She also has had diarrhea associated with the pain.  She has not noticed any fever.  She has been taking the Bentyl and hydrocodone prescribed to her but it has not relieved her pain.     Past Medical History:  Diagnosis Date  . Hypertension     There are no active problems to display for this patient.   History reviewed. No pertinent surgical history.   OB History   None      Home Medications    Prior to Admission medications   Medication Sig Start Date End Date Taking? Authorizing Provider  acetaminophen (TYLENOL) 500 MG tablet Take 1,000 mg by mouth every 6 (six) hours as needed for moderate pain.    [provider]  amLODipine (NORVASC) 5 MG tablet Take by mouth.    [provider]  diclofenac sodium (VOLTAREN) 1 % GEL Apply 4 g topically 4 (four) times daily. 03/29/17   Gwyneth Sprout, MD  dicyclomine (BENTYL) 20 MG tablet Take 1 tablet (20 mg total) by mouth every 12 (twelve) hours as needed for spasms. Use for abdominal pain/cramping, as prescribed. 10/19/17   Antony Madura, PA-C  HYDROcodone-acetaminophen (NORCO/VICODIN) 5-325 MG tablet Take 1 tablet by mouth every 6 (six) hours as needed for severe pain. 10/19/17   Antony Madura, PA-C  meloxicam (MOBIC) 7.5 MG tablet Take 1 tablet (7.5 mg total) by mouth daily. 03/29/17    Gwyneth Sprout, MD    Family History History reviewed. No pertinent family history.  Social History Social History   Tobacco Use  . Smoking status: Current Every Day Smoker    Types: Cigarettes  . Smokeless tobacco: Never Used  Substance Use Topics  . Alcohol use: Yes  . Drug use: No     Allergies   Benadryl [diphenhydramine hcl (sleep)]   Review of Systems Review of Systems  Gastrointestinal: Positive for abdominal pain, diarrhea, nausea and vomiting.  All other systems reviewed and are negative.    Physical Exam Updated Vital Signs BP 138/84   Pulse 86   Temp 98.6 F (37 C) (Oral)   Resp 18   Ht 5\' 5"  (1.651 m)   Wt (!) 176.4 kg (389 lb)   LMP 09/28/2017 (Approximate)   SpO2 92%   BMI 64.73 kg/m   Physical Exam  Constitutional: She is oriented to person, place, and time. She appears well-developed and well-nourished. No distress.  HENT:  Head: Normocephalic and atraumatic.  Right Ear: Hearing normal.  Left Ear: Hearing normal.  Nose: Nose normal.  Mouth/Throat: Oropharynx is clear and moist and mucous membranes are normal.  Eyes: Pupils are equal, round, and reactive to light. Conjunctivae and EOM are normal.  Neck: Normal range of motion. Neck supple.  Cardiovascular: Regular rhythm, S1 normal and S2 normal. Exam reveals no gallop  and no friction rub.  No murmur heard. Pulmonary/Chest: Effort normal and breath sounds normal. No respiratory distress. She exhibits no tenderness.  Abdominal: Soft. Normal appearance and bowel sounds are normal. There is no hepatosplenomegaly. There is tenderness in the right upper quadrant and epigastric area. There is no rebound, no guarding, no tenderness at McBurney's point and negative Murphy's sign. No hernia.  Musculoskeletal: Normal range of motion.  Neurological: She is alert and oriented to person, place, and time. She has normal strength. No cranial nerve deficit or sensory deficit. Coordination normal. GCS eye  subscore is 4. GCS verbal subscore is 5. GCS motor subscore is 6.  Skin: Skin is warm, dry and intact. No rash noted. No cyanosis.  Psychiatric: She has a normal mood and affect. Her speech is normal and behavior is normal. Thought content normal.  Nursing note and vitals reviewed.    ED Treatments / Results  Labs (all labs ordered are listed, but only abnormal results are displayed) Labs Reviewed  CBC WITH DIFFERENTIAL/PLATELET - Abnormal; Notable for the following components:      Result Value   WBC 10.6 (*)    Hemoglobin 10.1 (*)    HCT 35.8 (*)    MCH 22.0 (*)    MCHC 28.2 (*)    RDW 19.5 (*)    Platelets 469 (*)    All other components within normal limits  COMPREHENSIVE METABOLIC PANEL - Abnormal; Notable for the following components:   Total Protein 8.7 (*)    Albumin 3.1 (*)    All other components within normal limits  LIPASE, BLOOD    EKG None  Radiology No results found.  Procedures Procedures (including critical care time)  Medications Ordered in ED Medications  ondansetron (ZOFRAN-ODT) disintegrating tablet 4 mg (4 mg Oral Given 10/20/17 1845)  oxyCODONE-acetaminophen (PERCOCET/ROXICET) 5-325 MG per tablet 1 tablet (1 tablet Oral Given 10/20/17 1912)  sodium chloride 0.9 % bolus 500 mL (0 mLs Intravenous Stopped 10/21/17 0203)  ondansetron (ZOFRAN) injection 4 mg (4 mg Intravenous Given 10/21/17 0039)  HYDROmorphone (DILAUDID) injection 1 mg (1 mg Intravenous Given 10/21/17 0039)  HYDROmorphone (DILAUDID) injection 1 mg (1 mg Intravenous Given 10/21/17 0233)     Initial Impression / Assessment and Plan / ED Course  I have reviewed the triage vital signs and the nursing notes.  Pertinent labs & imaging results that were available during my care of the patient were reviewed by me and considered in my medical decision making (see chart for details).     Patient presents to the emergency department for evaluation of abdominal pain.  Patient seen 1 day ago  with similar symptoms, ultrasound confirmed gallstones without any signs of cholecystitis.  Patient has had persistent pain since that visit.  Pain significantly worsens when she eats.  She has right upper quadrant and epigastric tenderness but no peritonitis.  Lab work remains unremarkable.  Patient administered 2 doses of IV Dilaudid here in the ER, has persistent pain.  Will consult general surgery.  Final Clinical Impressions(s) / ED Diagnoses   Final diagnoses:  Biliary colic    ED Discharge Orders    None       Jakub Debold, Canary Brimhristopher J, MD 10/21/17 [phone]

## 2017-10-21 ENCOUNTER — Encounter (HOSPITAL_COMMUNITY): Payer: Self-pay | Admitting: General Practice

## 2017-10-21 ENCOUNTER — Other Ambulatory Visit: Payer: Self-pay

## 2017-10-21 DIAGNOSIS — F17210 Nicotine dependence, cigarettes, uncomplicated: Secondary | ICD-10-CM | POA: Diagnosis present

## 2017-10-21 DIAGNOSIS — K760 Fatty (change of) liver, not elsewhere classified: Secondary | ICD-10-CM | POA: Diagnosis present

## 2017-10-21 DIAGNOSIS — I10 Essential (primary) hypertension: Secondary | ICD-10-CM | POA: Diagnosis present

## 2017-10-21 DIAGNOSIS — Z791 Long term (current) use of non-steroidal anti-inflammatories (NSAID): Secondary | ICD-10-CM | POA: Diagnosis not present

## 2017-10-21 DIAGNOSIS — K819 Cholecystitis, unspecified: Secondary | ICD-10-CM | POA: Diagnosis present

## 2017-10-21 DIAGNOSIS — K8000 Calculus of gallbladder with acute cholecystitis without obstruction: Secondary | ICD-10-CM | POA: Diagnosis present

## 2017-10-21 DIAGNOSIS — Z7984 Long term (current) use of oral hypoglycemic drugs: Secondary | ICD-10-CM | POA: Diagnosis not present

## 2017-10-21 DIAGNOSIS — Z79899 Other long term (current) drug therapy: Secondary | ICD-10-CM | POA: Diagnosis not present

## 2017-10-21 DIAGNOSIS — Z79891 Long term (current) use of opiate analgesic: Secondary | ICD-10-CM | POA: Diagnosis not present

## 2017-10-21 DIAGNOSIS — Z6841 Body Mass Index (BMI) 40.0 and over, adult: Secondary | ICD-10-CM | POA: Diagnosis not present

## 2017-10-21 DIAGNOSIS — Z888 Allergy status to other drugs, medicaments and biological substances status: Secondary | ICD-10-CM | POA: Diagnosis not present

## 2017-10-21 DIAGNOSIS — D649 Anemia, unspecified: Secondary | ICD-10-CM | POA: Diagnosis present

## 2017-10-21 DIAGNOSIS — R7303 Prediabetes: Secondary | ICD-10-CM | POA: Diagnosis present

## 2017-10-21 LAB — SURGICAL PCR SCREEN
MRSA, PCR: NEGATIVE
STAPHYLOCOCCUS AUREUS: NEGATIVE

## 2017-10-21 LAB — HIV ANTIBODY (ROUTINE TESTING W REFLEX): HIV Screen 4th Generation wRfx: NONREACTIVE

## 2017-10-21 MED ORDER — DOCUSATE SODIUM 100 MG PO CAPS
100.0000 mg | ORAL_CAPSULE | Freq: Two times a day (BID) | ORAL | Status: DC
  Administered 2017-10-21 – 2017-10-23 (×6): 100 mg via ORAL
  Filled 2017-10-21 (×6): qty 1

## 2017-10-21 MED ORDER — OXYCODONE HCL 5 MG PO TABS
5.0000 mg | ORAL_TABLET | ORAL | Status: DC | PRN
  Administered 2017-10-21 – 2017-10-23 (×7): 10 mg via ORAL
  Filled 2017-10-21 (×7): qty 2

## 2017-10-21 MED ORDER — HYDROMORPHONE HCL 1 MG/ML IJ SOLN
0.5000 mg | INTRAMUSCULAR | Status: DC | PRN
  Administered 2017-10-21 – 2017-10-23 (×8): 1 mg via INTRAVENOUS
  Filled 2017-10-21 (×8): qty 1

## 2017-10-21 MED ORDER — ENOXAPARIN SODIUM 40 MG/0.4ML ~~LOC~~ SOLN
40.0000 mg | SUBCUTANEOUS | Status: DC
  Administered 2017-10-21: 40 mg via SUBCUTANEOUS
  Filled 2017-10-21: qty 0.4

## 2017-10-21 MED ORDER — HYDRALAZINE HCL 20 MG/ML IJ SOLN: 10.0000 mg | INTRAMUSCULAR | Status: DC | PRN

## 2017-10-21 MED ORDER — ONDANSETRON 4 MG PO TBDP: 4.0000 mg | ORAL_TABLET | Freq: Four times a day (QID) | ORAL | Status: DC | PRN

## 2017-10-21 MED ORDER — METOPROLOL TARTRATE 5 MG/5ML IV SOLN: 5.0000 mg | Freq: Four times a day (QID) | INTRAVENOUS | Status: DC | PRN

## 2017-10-21 MED ORDER — METHOCARBAMOL 500 MG PO TABS
500.0000 mg | ORAL_TABLET | Freq: Four times a day (QID) | ORAL | Status: DC | PRN
  Administered 2017-10-22: 500 mg via ORAL
  Filled 2017-10-21: qty 1

## 2017-10-21 MED ORDER — BISACODYL 10 MG RE SUPP: 10.0000 mg | Freq: Every day | RECTAL | Status: DC | PRN

## 2017-10-21 MED ORDER — ONDANSETRON HCL 4 MG/2ML IJ SOLN
4.0000 mg | Freq: Four times a day (QID) | INTRAMUSCULAR | Status: DC | PRN
  Administered 2017-10-22 (×2): 4 mg via INTRAVENOUS
  Filled 2017-10-21: qty 2

## 2017-10-21 MED ORDER — SODIUM CHLORIDE 0.9 % IV SOLN
INTRAVENOUS | Status: DC
  Administered 2017-10-21 – 2017-10-22 (×3): via INTRAVENOUS

## 2017-10-21 MED ORDER — SIMETHICONE 80 MG PO CHEW: 40.0000 mg | CHEWABLE_TABLET | Freq: Four times a day (QID) | ORAL | Status: DC | PRN

## 2017-10-21 MED ORDER — ACETAMINOPHEN 325 MG PO TABS: 650.0000 mg | ORAL_TABLET | Freq: Four times a day (QID) | ORAL | Status: DC | PRN

## 2017-10-21 MED ORDER — AMLODIPINE BESYLATE 5 MG PO TABS
5.0000 mg | ORAL_TABLET | Freq: Every day | ORAL | Status: DC
  Administered 2017-10-21 – 2017-10-23 (×3): 5 mg via ORAL
  Filled 2017-10-21 (×3): qty 1

## 2017-10-21 MED ORDER — ACETAMINOPHEN 650 MG RE SUPP
650.0000 mg | Freq: Four times a day (QID) | RECTAL | Status: DC | PRN
Start: 2017-10-21 — End: 2017-10-23

## 2017-10-21 MED ORDER — IBUPROFEN 600 MG PO TABS: 600.0000 mg | ORAL_TABLET | Freq: Four times a day (QID) | ORAL | Status: DC | PRN

## 2017-10-21 MED ORDER — SODIUM CHLORIDE 0.9 % IV SOLN
2.0000 g | INTRAVENOUS | Status: DC
  Administered 2017-10-21 – 2017-10-23 (×3): 2 g via INTRAVENOUS
  Filled 2017-10-21 (×2): qty 20
  Filled 2017-10-21: qty 2

## 2017-10-21 MED ORDER — FAMOTIDINE IN NACL 20-0.9 MG/50ML-% IV SOLN
20.0000 mg | Freq: Two times a day (BID) | INTRAVENOUS | Status: DC
  Administered 2017-10-21 – 2017-10-23 (×6): 20 mg via INTRAVENOUS
  Filled 2017-10-21 (×6): qty 50

## 2017-10-21 MED ORDER — HYDROMORPHONE HCL 1 MG/ML IJ SOLN
1.0000 mg | Freq: Once | INTRAMUSCULAR | Status: AC
  Administered 2017-10-21: 1 mg via INTRAVENOUS
  Filled 2017-10-21: qty 1

## 2017-10-21 NOTE — Progress Notes (Signed)
Received patient from ED, AOx4, ambulatory, VSS and RUQ pain at 10/10.  Pre-op done, oriented to room, bed controls and call light. Patient then ambulated in room and took shower.  Administered PRN pain medication Oxy IR 10 mg per order since Dilaudid given in ED did not help ease the pain.  Patient now resting on bed trying to get sleep.  Will monitor.

## 2017-10-21 NOTE — ED Notes (Signed)
ED Provider at bedside. 

## 2017-10-21 NOTE — H&P (Signed)
Surgical H&P  CC: abdominal pain  HPI: 32yo woman with severe obesity presented to the ER for the second time in as many days with intractable right upper quadrant and epigastric pain, associated nausea and emesis as well as diarrhea. Symptoms began around 7/27 with epigastric bloating, anorexia and pain unrelieved by Tums.  Ultrasound 7/29 confirms gallstones but at the time there were no signs of cholecystitis. Hepatic steatosis noted. CBC, CMP and lipase on 7/28 were unremarkable. She had some symptomatic improvement with medications in ER and was discharged, had a follow up with our office on 8/7 scheduled. Since leaving the ER she has had constant RUQ and epigastric pain. Pain aggravated by eating. No fevers. Has tried bentyl and lortab which have not relieved the pain.  Returned to ER where vitals and labs remain normal. Pain has not been relieved by multiple doses of dilaudid.   She is currently unemployed.   Allergies  Allergen Reactions  . Benadryl [Diphenhydramine Hcl (Sleep)] Hives    Past Medical History:  Diagnosis Date  . Hypertension     Surgical hx- c section x 2  History reviewed. No pertinent family history.  Social History   Socioeconomic History  . Marital status: Single    Spouse name: Not on file  . Number of children: Not on file  . Years of education: Not on file  . Highest education level: Not on file  Occupational History  . Not on file  Social Needs  . Financial resource strain: Not on file  . Food insecurity:    Worry: Not on file    Inability: Not on file  . Transportation needs:    Medical: Not on file    Non-medical: Not on file  Tobacco Use  . Smoking status: Current Every Day Smoker    Types: Cigarettes  . Smokeless tobacco: Never Used  Substance and Sexual Activity  . Alcohol use: Yes  . Drug use: No  . Sexual activity: Not on file  Lifestyle  . Physical activity:    Days per week: Not on file    Minutes per session: Not on file   . Stress: Not on file  Relationships  . Social connections:    Talks on phone: Not on file    Gets together: Not on file    Attends religious service: Not on file    Active member of club or organization: Not on file    Attends meetings of clubs or organizations: Not on file    Relationship status: Not on file  Other Topics Concern  . Not on file  Social History Narrative  . Not on file    No current facility-administered medications on file prior to encounter.    Current Outpatient Medications on File Prior to Encounter  Medication Sig Dispense Refill  . acetaminophen (TYLENOL) 500 MG tablet Take 1,000 mg by mouth every 6 (six) hours as needed for moderate pain.    Marland Kitchen. amLODipine (NORVASC) 5 MG tablet Take by mouth.    . diclofenac sodium (VOLTAREN) 1 % GEL Apply 4 g topically 4 (four) times daily. 100 g 0  . dicyclomine (BENTYL) 20 MG tablet Take 1 tablet (20 mg total) by mouth every 12 (twelve) hours as needed for spasms. Use for abdominal pain/cramping, as prescribed. 20 tablet 0  . HYDROcodone-acetaminophen (NORCO/VICODIN) 5-325 MG tablet Take 1 tablet by mouth every 6 (six) hours as needed for severe pain. 6 tablet 0  . meloxicam (MOBIC) 7.5 MG tablet  Take 1 tablet (7.5 mg total) by mouth daily. 20 tablet 0    Review of Systems: a complete, 10pt review of systems was completed with pertinent positives and negatives as documented in the HPI  Physical Exam: Vitals:   10/21/17 0100 10/21/17 0130  BP: (!) 149/85 138/84  Pulse: 78 86  Resp:    Temp:    SpO2: 96% 92%   BMI 64 Gen: A&Ox3, no distress  Head: normocephalic, atraumatic Eyes: extraocular motions intact, anicteric.  Neck: supple without mass or thyromegaly Chest: unlabored respirations, symmetrical air entry, clear bilaterally   Cardiovascular: RRR with palpable distal pulses, no pedal edema Abdomen: soft, obese, epigastric and medial RUQ tenderness without guarding or peritonitis. No mass or organomegaly.   Extremities: warm, without edema, no deformities  Neuro: grossly intact Psych: appropriate mood and affect, normal insight  Skin: warm and dry   CBC Latest Ref Rng & Units 10/20/2017 10/18/2017 02/13/2017  WBC 4.0 - 10.5 K/uL 10.6(H) 10.7(H) 10.7(H)  Hemoglobin 12.0 - 15.0 g/dL 10.1(L) 9.6(L) 9.8(L)  Hematocrit 36.0 - 46.0 % 35.8(L) 33.7(L) 32.8(L)  Platelets 150 - 400 K/uL 469(H) 419(H) 384    CMP Latest Ref Rng & Units 10/20/2017 10/18/2017 02/13/2017  Glucose 70 - 99 mg/dL 92 960(A) 86  BUN 6 - 20 mg/dL 7 8 6   Creatinine 0.44 - 1.00 mg/dL 5.40 9.81 1.91  Sodium 135 - 145 mmol/L 137 137 138  Potassium 3.5 - 5.1 mmol/L 3.8 3.6 3.8  Chloride 98 - 111 mmol/L 102 101 104  CO2 22 - 32 mmol/L 26 27 26   Calcium 8.9 - 10.3 mg/dL 9.2 4.7(W) 2.9(F)  Total Protein 6.5 - 8.1 g/dL 6.2(Z) 8.1 7.9  Total Bilirubin 0.3 - 1.2 mg/dL 0.5 3.0(Q) 0.4  Alkaline Phos 38 - 126 U/L 96 86 90  AST 15 - 41 U/L 31 15 32  ALT 0 - 44 U/L 33 16 14    No results found for: INR, PROTIME  Imaging: ABDOMEN ULTRASOUND COMPLETE  COMPARISON:  None.  FINDINGS: Technically limited exam due to patient body habitus.  Gallbladder: Physiologically distended containing small gallstones. No gallbladder wall thickening or pericholecystic fluid. No sonographic Murphy sign noted by sonographer.  Common bile duct: Diameter: 2-4 mm, normal.  Liver: Diffusely increased in parenchymal echogenicity. The previous exophytic hepatic nodule on CT is not seen sonographically. Portal vein is patent on color Doppler imaging with normal direction of blood flow towards the liver.  IVC: Not well visualized.  Pancreas: Not well visualized.  Spleen: Normal in size but technically limited.  Right Kidney: Length: 12.1 cm. Echogenicity within normal limits. No mass or hydronephrosis visualized.  Left Kidney: Length: 11.8 cm. Echogenicity within normal limits. No mass or hydronephrosis visualized.  Abdominal  aorta: No aneurysm visualized. Portions obscured by bowel gas.  Other findings: None.  No ascites.  IMPRESSION: 1. Gallstones without sonographic findings of acute cholecystitis. 2. Hepatic steatosis. 3. Midline structures including the pancreas, IVC, and portions of the abdominal aorta are obscured and not well visualized.   Electronically Signed   By: Rubye Oaks M.D.   On: 10/19/2017 02:16   A/P: 31yo severely obese woman with clinical cholecystitis. Admit for fluid resuscitation, symptom control and empiric antibiotics. Cholecystectomy likely tomorrow with Dr. Luisa Hart pending OR availability. Discussed risks of surgery including bleeding, pain, scarring, intraabdominal injury specifically to the common bile duct and sequelae, conversion to open surgery, blood clot, pneumonia, heart attack, stroke, failure to resolve symptoms, etc/ Questions welcomed and  answered.    Phylliss Blakes, MD [facility], [place] Pager [phone]

## 2017-10-21 NOTE — Progress Notes (Signed)
[facility] Progress Note     Subjective: CC:  Still having mild RUQ/epigastric pain. No new complaints. Having bowel function. Nausea controlled. Family at bedside.  Objective: Vital signs in last 24 hours: Temp:  [98.6 F (37 C)-98.7 F (37.1 C)] 98.7 F (37.1 C) (07/31 0417) Pulse Rate:  [78-90] 79 (07/31 0417) Resp:  [18] 18 (07/31 0417) BP: (124-158)/(80-92) 136/82 (07/31 0417) SpO2:  [92 %-100 %] 98 % (07/31 0417) Weight:  [176.4 kg (389 lb)-176.9 kg (390 lb)] 176.9 kg (390 lb) (07/31 0417) Last BM Date: 10/20/17  Intake/Output from previous day: 07/30 0701 - 07/31 0700 In: 646 [P.O.:60; I.V.:30.8; IV Piggyback:555.2] Out: -  Intake/Output this shift: Total I/O In: -  Out: 200 [Urine:200]  PE: Gen:  Alert, NAD, pleasant Card:  Regular rate and rhythm, pedal pulses 2+ BL Pulm:  Normal effort, clear to auscultation bilaterally Abd: Soft, obese, mild TTP epigastrium, +BS Skin: warm and dry, no rashes  Psych: A&Ox3   Lab Results:  Recent Labs    10/18/17 2252 10/20/17 1910  WBC 10.7* 10.6*  HGB 9.6* 10.1*  HCT 33.7* 35.8*  PLT 419* 469*   BMET Recent Labs    10/18/17 2252 10/20/17 1910  NA 137 137  K 3.6 3.8  CL 101 102  CO2 27 26  GLUCOSE 107* 92  BUN 8 7  CREATININE 0.58 0.58  CALCIUM 8.8* 9.2   PT/INR No results for input(s): LABPROT, INR in the last 72 hours. CMP     Component Value Date/Time   NA 137 10/20/2017 1910   K 3.8 10/20/2017 1910   CL 102 10/20/2017 1910   CO2 26 10/20/2017 1910   GLUCOSE 92 10/20/2017 1910   BUN 7 10/20/2017 1910   CREATININE 0.58 10/20/2017 1910   CALCIUM 9.2 10/20/2017 1910   PROT 8.7 (H) 10/20/2017 1910   ALBUMIN 3.1 (L) 10/20/2017 1910   AST 31 10/20/2017 1910   ALT 33 10/20/2017 1910   ALKPHOS 96 10/20/2017 1910   BILITOT 0.5 10/20/2017 1910   GFRNONAA >60 10/20/2017 1910   GFRAA >60 10/20/2017 1910   Lipase     Component Value Date/Time   LIPASE 27 10/20/2017 1910        Studies/Results: No results found.  Anti-infectives: Anti-infectives (From admission, onward)   Start     Dose/Rate Route Frequency Ordered Stop   10/21/17 0400  cefTRIAXone (ROCEPHIN) 2 g in sodium chloride 0.9 % 100 mL IVPB     2 g 200 mL/hr over 30 Minutes Intravenous Every 24 hours 10/21/17 0337       Assessment/Plan Morbid obesity   Cholecystitis  - clear liquids, NPO after MN - IV abx  - OR tomorrow for lap chole  - OOB/mobilize    LOS: 0 days    Elizabeth S Simaan , PA-C [facility] 10/21/2017, 10:00 AM Pager: [phone] Consults: [phone] Mon-Fri 7:00 am-4:30 pm Sat-Sun 7:00 am-11:30 am   

## 2017-10-21 NOTE — ED Notes (Signed)
RN attempted to start IV x 2; 2nd RN to start IV-Monique,RN

## 2017-10-21 NOTE — H&P (View-Only) (Signed)
[facility] Progress Note     Subjective: CC:  Still having mild RUQ/epigastric pain. No new complaints. Having bowel function. Nausea controlled. Family at bedside.  Objective: Vital signs in last 24 hours: Temp:  [98.6 F (37 C)-98.7 F (37.1 C)] 98.7 F (37.1 C) (07/31 0417) Pulse Rate:  [78-90] 79 (07/31 0417) Resp:  [18] 18 (07/31 0417) BP: (124-158)/(80-92) 136/82 (07/31 0417) SpO2:  [92 %-100 %] 98 % (07/31 0417) Weight:  [176.4 kg (389 lb)-176.9 kg (390 lb)] 176.9 kg (390 lb) (07/31 0417) Last BM Date: 10/20/17  Intake/Output from previous day: 07/30 0701 - 07/31 0700 In: 646 [P.O.:60; I.V.:30.8; IV Piggyback:555.2] Out: -  Intake/Output this shift: Total I/O In: -  Out: 200 [Urine:200]  PE: Gen:  Alert, NAD, pleasant Card:  Regular rate and rhythm, pedal pulses 2+ BL Pulm:  Normal effort, clear to auscultation bilaterally Abd: Soft, obese, mild TTP epigastrium, +BS Skin: warm and dry, no rashes  Psych: A&Ox3   Lab Results:  Recent Labs    10/18/17 2252 10/20/17 1910  WBC 10.7* 10.6*  HGB 9.6* 10.1*  HCT 33.7* 35.8*  PLT 419* 469*   BMET Recent Labs    10/18/17 2252 10/20/17 1910  NA 137 137  K 3.6 3.8  CL 101 102  CO2 27 26  GLUCOSE 107* 92  BUN 8 7  CREATININE 0.58 0.58  CALCIUM 8.8* 9.2   PT/INR No results for input(s): LABPROT, INR in the last 72 hours. CMP     Component Value Date/Time   NA 137 10/20/2017 1910   K 3.8 10/20/2017 1910   CL 102 10/20/2017 1910   CO2 26 10/20/2017 1910   GLUCOSE 92 10/20/2017 1910   BUN 7 10/20/2017 1910   CREATININE 0.58 10/20/2017 1910   CALCIUM 9.2 10/20/2017 1910   PROT 8.7 (H) 10/20/2017 1910   ALBUMIN 3.1 (L) 10/20/2017 1910   AST 31 10/20/2017 1910   ALT 33 10/20/2017 1910   ALKPHOS 96 10/20/2017 1910   BILITOT 0.5 10/20/2017 1910   GFRNONAA >60 10/20/2017 1910   GFRAA >60 10/20/2017 1910   Lipase     Component Value Date/Time   LIPASE 27 10/20/2017 1910        Studies/Results: No results found.  Anti-infectives: Anti-infectives (From admission, onward)   Start     Dose/Rate Route Frequency Ordered Stop   10/21/17 0400  cefTRIAXone (ROCEPHIN) 2 g in sodium chloride 0.9 % 100 mL IVPB     2 g 200 mL/hr over 30 Minutes Intravenous Every 24 hours 10/21/17 [id number]       Assessment/Plan Morbid obesity   Cholecystitis  - clear liquids, NPO after MN - IV abx  - OR tomorrow for lap chole  - OOB/mobilize    LOS: 0 days    Adam PhenixElizabeth S Denisha Hoel , [facility] 10/21/2017, 10:00 AM Pager: [phone] Consults: [phone] Mon-Fri 7:00 am-4:30 pm Sat-Sun 7:00 am-11:30 am

## 2017-10-22 ENCOUNTER — Inpatient Hospital Stay (HOSPITAL_COMMUNITY): Payer: Medicaid Other | Admitting: Anesthesiology

## 2017-10-22 ENCOUNTER — Encounter (HOSPITAL_COMMUNITY): Admission: EM | Disposition: A | Discharge status: Home / Self Care | Payer: Self-pay | Source: Home / Self Care

## 2017-10-22 ENCOUNTER — Encounter (HOSPITAL_COMMUNITY): Payer: Self-pay | Admitting: Anesthesiology

## 2017-10-22 HISTORY — PX: CHOLECYSTECTOMY: SHX55

## 2017-10-22 LAB — COMPREHENSIVE METABOLIC PANEL
ALK PHOS: 84 U/L (ref 38–126)
ALT: 24 U/L (ref 0–44)
AST: 17 U/L (ref 15–41)
Albumin: 2.9 g/dL — ABNORMAL LOW (ref 3.5–5.0)
Anion gap: 9 (ref 5–15)
BILIRUBIN TOTAL: 0.5 mg/dL (ref 0.3–1.2)
BUN: <5 mg/dL — ABNORMAL LOW (ref 6–20)
CALCIUM: 8.6 mg/dL — AB (ref 8.9–10.3)
CO2: 26 mmol/L (ref 22–32)
Chloride: 104 mmol/L (ref 98–111)
Creatinine, Ser: 0.62 mg/dL (ref 0.44–1.00)
Glucose, Bld: 92 mg/dL (ref 70–99)
Potassium: 3.9 mmol/L (ref 3.5–5.1)
Sodium: 139 mmol/L (ref 135–145)
TOTAL PROTEIN: 7.7 g/dL (ref 6.5–8.1)

## 2017-10-22 SURGERY — LAPAROSCOPIC CHOLECYSTECTOMY WITH INTRAOPERATIVE CHOLANGIOGRAM
Anesthesia: General | Site: Abdomen

## 2017-10-22 MED ORDER — 0.9 % SODIUM CHLORIDE (POUR BTL) OPTIME
TOPICAL | Status: DC | PRN
  Administered 2017-10-22: 1000 mL

## 2017-10-22 MED ORDER — SUGAMMADEX SODIUM 200 MG/2ML IV SOLN
INTRAVENOUS | Status: DC | PRN
  Administered 2017-10-22: 300 mg via INTRAVENOUS

## 2017-10-22 MED ORDER — SUCCINYLCHOLINE CHLORIDE 20 MG/ML IJ SOLN
INTRAMUSCULAR | Status: DC | PRN
  Administered 2017-10-22: 120 mg via INTRAVENOUS
  Administered 2017-10-22: 80 mg via INTRAVENOUS

## 2017-10-22 MED ORDER — PROPOFOL 10 MG/ML IV BOLUS
INTRAVENOUS | Status: AC
  Filled 2017-10-22: qty 20

## 2017-10-22 MED ORDER — MIDAZOLAM HCL 2 MG/2ML IJ SOLN
INTRAMUSCULAR | Status: AC
  Filled 2017-10-22: qty 2

## 2017-10-22 MED ORDER — SUGAMMADEX SODIUM 500 MG/5ML IV SOLN
INTRAVENOUS | Status: AC
  Filled 2017-10-22: qty 5

## 2017-10-22 MED ORDER — LACTATED RINGERS IV SOLN
INTRAVENOUS | Status: DC
  Administered 2017-10-22 – 2017-10-23 (×3): via INTRAVENOUS

## 2017-10-22 MED ORDER — BUPIVACAINE-EPINEPHRINE 0.25% -1:200000 IJ SOLN
INTRAMUSCULAR | Status: DC | PRN
  Administered 2017-10-22: 10 mL

## 2017-10-22 MED ORDER — LIDOCAINE 2% (20 MG/ML) 5 ML SYRINGE
INTRAMUSCULAR | Status: DC | PRN
  Administered 2017-10-22: 60 mg via INTRAVENOUS

## 2017-10-22 MED ORDER — KETOROLAC TROMETHAMINE 30 MG/ML IJ SOLN
30.0000 mg | Freq: Once | INTRAMUSCULAR | Status: AC | PRN
  Administered 2017-10-22: 30 mg via INTRAVENOUS

## 2017-10-22 MED ORDER — FENTANYL CITRATE (PF) 250 MCG/5ML IJ SOLN
INTRAMUSCULAR | Status: AC
Start: 2017-10-22 — End: ?
  Filled 2017-10-22: qty 5

## 2017-10-22 MED ORDER — ROCURONIUM BROMIDE 10 MG/ML (PF) SYRINGE
PREFILLED_SYRINGE | INTRAVENOUS | Status: DC | PRN
  Administered 2017-10-22: 70 mg via INTRAVENOUS

## 2017-10-22 MED ORDER — IOPAMIDOL (ISOVUE-300) INJECTION 61%
INTRAVENOUS | Status: AC
  Filled 2017-10-22: qty 50

## 2017-10-22 MED ORDER — MEPERIDINE HCL 50 MG/ML IJ SOLN: 6.2500 mg | INTRAMUSCULAR | Status: DC | PRN

## 2017-10-22 MED ORDER — DEXAMETHASONE SODIUM PHOSPHATE 10 MG/ML IJ SOLN
INTRAMUSCULAR | Status: DC | PRN
  Administered 2017-10-22: 10 mg via INTRAVENOUS

## 2017-10-22 MED ORDER — FENTANYL CITRATE (PF) 250 MCG/5ML IJ SOLN
INTRAMUSCULAR | Status: DC | PRN
  Administered 2017-10-22 (×2): 100 ug via INTRAVENOUS
  Administered 2017-10-22: 150 ug via INTRAVENOUS

## 2017-10-22 MED ORDER — SODIUM CHLORIDE 0.9 % IR SOLN
Status: DC | PRN
  Administered 2017-10-22: 1000 mL

## 2017-10-22 MED ORDER — HYDROMORPHONE HCL 1 MG/ML IJ SOLN
0.2500 mg | INTRAMUSCULAR | Status: DC | PRN
  Administered 2017-10-22: 0.5 mg via INTRAVENOUS

## 2017-10-22 MED ORDER — PROMETHAZINE HCL 25 MG/ML IJ SOLN: 6.2500 mg | INTRAMUSCULAR | Status: DC | PRN

## 2017-10-22 MED ORDER — HYDROMORPHONE HCL 1 MG/ML IJ SOLN
INTRAMUSCULAR | Status: AC
  Filled 2017-10-22: qty 1

## 2017-10-22 MED ORDER — KETOROLAC TROMETHAMINE 30 MG/ML IJ SOLN
INTRAMUSCULAR | Status: AC
  Filled 2017-10-22: qty 1

## 2017-10-22 MED ORDER — MIDAZOLAM HCL 5 MG/5ML IJ SOLN
INTRAMUSCULAR | Status: DC | PRN
  Administered 2017-10-22: 2 mg via INTRAVENOUS

## 2017-10-22 MED ORDER — PROPOFOL 10 MG/ML IV BOLUS
INTRAVENOUS | Status: DC | PRN
  Administered 2017-10-22: 200 mg via INTRAVENOUS
  Administered 2017-10-22: 30 mg via INTRAVENOUS
  Administered 2017-10-22: 200 mg via INTRAVENOUS

## 2017-10-22 SURGICAL SUPPLY — 40 items
APPLIER CLIP ROT 10 11.4 M/L (STAPLE) ×3
BLADE CLIPPER SURG (BLADE) IMPLANT
CANISTER SUCT 3000ML PPV (MISCELLANEOUS) ×3 IMPLANT
CHLORAPREP W/TINT 26ML (MISCELLANEOUS) ×3 IMPLANT
CLIP APPLIE ROT 10 11.4 M/L (STAPLE) ×1 IMPLANT
COVER MAYO STAND STRL (DRAPES) ×3 IMPLANT
COVER SURGICAL LIGHT HANDLE (MISCELLANEOUS) ×3 IMPLANT
DERMABOND ADVANCED (GAUZE/BANDAGES/DRESSINGS) ×2
DERMABOND ADVANCED .7 DNX12 (GAUZE/BANDAGES/DRESSINGS) ×1 IMPLANT
DRAPE C-ARM 42X72 X-RAY (DRAPES) ×3 IMPLANT
DRAPE WARM FLUID 44X44 (DRAPE) ×3 IMPLANT
ELECT REM PT RETURN 9FT ADLT (ELECTROSURGICAL) ×3
ELECTRODE REM PT RTRN 9FT ADLT (ELECTROSURGICAL) ×1 IMPLANT
GLOVE BIO SURGEON STRL SZ8 (GLOVE) ×3 IMPLANT
GLOVE BIOGEL PI IND STRL 8 (GLOVE) ×1 IMPLANT
GLOVE BIOGEL PI INDICATOR 8 (GLOVE) ×2
GOWN STRL REUS W/ TWL LRG LVL3 (GOWN DISPOSABLE) ×2 IMPLANT
GOWN STRL REUS W/ TWL XL LVL3 (GOWN DISPOSABLE) ×1 IMPLANT
GOWN STRL REUS W/TWL LRG LVL3 (GOWN DISPOSABLE) ×4
GOWN STRL REUS W/TWL XL LVL3 (GOWN DISPOSABLE) ×2
KIT BASIN OR (CUSTOM PROCEDURE TRAY) ×3 IMPLANT
KIT TURNOVER KIT B (KITS) ×3 IMPLANT
NS IRRIG 1000ML POUR BTL (IV SOLUTION) ×3 IMPLANT
PAD ARMBOARD 7.5X6 YLW CONV (MISCELLANEOUS) ×3 IMPLANT
POUCH RETRIEVAL ECOSAC 10 (ENDOMECHANICALS) ×1 IMPLANT
POUCH RETRIEVAL ECOSAC 10MM (ENDOMECHANICALS) ×2
SCISSORS LAP 5X35 DISP (ENDOMECHANICALS) ×3 IMPLANT
SET CHOLANGIOGRAPH 5 50 .035 (SET/KITS/TRAYS/PACK) ×1 IMPLANT
SET IRRIG TUBING LAPAROSCOPIC (IRRIGATION / IRRIGATOR) ×3 IMPLANT
SLEEVE ENDOPATH XCEL 5M (ENDOMECHANICALS) ×3 IMPLANT
SPECIMEN JAR SMALL (MISCELLANEOUS) ×3 IMPLANT
SUT MNCRL AB 4-0 PS2 18 (SUTURE) ×3 IMPLANT
TOWEL OR 17X24 6PK STRL BLUE (TOWEL DISPOSABLE) ×3 IMPLANT
TOWEL OR 17X26 10 PK STRL BLUE (TOWEL DISPOSABLE) ×3 IMPLANT
TRAY LAPAROSCOPIC MC (CUSTOM PROCEDURE TRAY) ×3 IMPLANT
TROCAR XCEL BLUNT TIP 100MML (ENDOMECHANICALS) ×3 IMPLANT
TROCAR XCEL NON-BLD 11X100MML (ENDOMECHANICALS) ×3 IMPLANT
TROCAR XCEL NON-BLD 5MMX100MML (ENDOMECHANICALS) ×3 IMPLANT
TUBING INSUFFLATION (TUBING) ×3 IMPLANT
WATER STERILE IRR 1000ML POUR (IV SOLUTION) ×3 IMPLANT

## 2017-10-22 NOTE — Interval H&P Note (Signed)
History and Physical Interval Note:  10/22/2017 1:11 PM  Nicole Hurley  has presented today for surgery, with the diagnosis of Cholecystitis  The various methods of treatment have been discussed with the patient and family. After consideration of risks, benefits and other options for treatment, the patient has consented to  Procedure(s): LAPAROSCOPIC CHOLECYSTECTOMY WITH POSSIBLE INTRAOPERATIVE CHOLANGIOGRAM (N/A) as a surgical intervention .  The patient's history has been reviewed, patient examined, no change in status, stable for surgery.  I have reviewed the patient's chart and labs.  Questions were answered to the patient's satisfaction.     Margit Batte A Garwood Wentzell

## 2017-10-22 NOTE — Discharge Instructions (Signed)

## 2017-10-22 NOTE — Op Note (Signed)
Laparoscopic Cholecystectomy  Procedure Note  Indications: This patient presents with symptomatic gallbladder disease and will undergo laparoscopic cholecystectomy. The procedure has been discussed with the patient. Operative and non operative treatments have been discussed. Risks of surgery include bleeding, infection,  Common bile duct injury,  Injury to the stomach,liver, colon,small intestine, abdominal wall,  Diaphragm,  Major blood vessels,  And the need for an open procedure.  Other risks include worsening of medical problems, death,  DVT and pulmonary embolism, and cardiovascular events.   Medical options have also been discussed. The patient has been informed of long term expectations of surgery and non surgical options,  The patient agrees to proceed.    Pre-operative Diagnosis: Calculus of gallbladder with acute cholecystitis, without mention of obstruction  Post-operative Diagnosis: Same  Surgeon: Clovis Puhomas A Gayathri Futrell   Assistants: Rayburn PA   Anesthesia: General endotracheal anesthesia and Local anesthesia 0.25.% bupivacaine  ASA Class: 3  Procedure Details  The patient was seen again in the Holding Room. The risks, benefits, complications, treatment options, and expected outcomes were discussed with the patient. The possibilities of reaction to medication, pulmonary aspiration, perforation of viscus, bleeding, recurrent infection, finding a normal gallbladder, the need for additional procedures, failure to diagnose a condition, the possible need to convert to an open procedure, and creating a complication requiring transfusion or operation were discussed with the patient. The patient and/or family concurred with the proposed plan, giving informed consent. The site of surgery properly noted/marked. The patient was taken to Operating Room, identified as Nicole Hurley and the procedure verified as Laparoscopic Cholecystectomy with Intraoperative Cholangiograms. A Time Out was held and  the above information confirmed.  Prior to the induction of general anesthesia, antibiotic prophylaxis was administered. General endotracheal anesthesia was then administered and tolerated well. After the induction, the abdomen was prepped in the usual sterile fashion. The patient was positioned in the supine position with the left arm comfortably tucked, along with some reverse Trendelenburg.  Local anesthetic agent was injected into the skin near the umbilicus and an incision made. The midline fascia was incised and the Hasson technique was used to introduce a 12 mm port under direct vision. It was secured with a figure of eight Vicryl suture placed in the usual fashion. Pneumoperitoneum was then created with CO2 and tolerated well without any adverse changes in the patient's vital signs. Additional trocars were introduced under direct vision with an 11 mm trocar in the epigastrium and 2 5 mm trocars in the right upper quadrant. All skin incisions were infiltrated with a local anesthetic agent before making the incision and placing the trocars.   The gallbladder was identified, the fundus grasped and retracted cephalad. Adhesions were lysed bluntly and with the electrocautery where indicated, taking care not to injure any adjacent organs or viscus. The infundibulum was grasped and retracted laterally, exposing the peritoneum overlying the triangle of Calot. This was then divided and exposed in a blunt fashion. The cystic duct was clearly identified and bluntly dissected circumferentially. The junctions of the gallbladder, cystic duct and common bile duct were clearly identified prior to the division of any linear structure.   The cystic duct was thin and fragile and tore with manipulation.  Therefore a cholangiogram was not performed.  The critical view was obtained.     The cystic duct was then  ligated with surgical clips  on the patient side and  clipped on the gallbladder side and divided. The  cystic artery was identified,  dissected free, ligated with clips and divided as well. Posterior cystic artery clipped and divided.  The gallbladder was dissected from the liver bed in retrograde fashion with the electrocautery. The gallbladder was removed. The liver bed was irrigated and inspected. Hemostasis was achieved with the electrocautery. Copious irrigation was utilized and was repeatedly aspirated until clear all particulate matter. Hemostasis was achieved with no signs  Of bleeding or bile leakage.  Pneumoperitoneum was completely reduced after viewing removal of the trocars under direct vision. The wound was thoroughly irrigated and the fascia was then closed with a figure of eight suture; the skin was then closed with 4 0 monocryl  and a sterile dressing was applied.  Instrument, sponge, and needle counts were correct at closure and at the conclusion of the case.   Findings: Cholecystitis with Cholelithiasis  Estimated Blood Loss: less than 50 mL         Drains: none         Total IV Fluids: per record          Specimens: Gallbladder           Complications: None; patient tolerated the procedure well.         Disposition: PACU - hemodynamically stable.         Condition: stable

## 2017-10-22 NOTE — Anesthesia Preprocedure Evaluation (Addendum)
Anesthesia Evaluation  Patient identified by MRN, date of birth, ID band Patient awake    Reviewed: Allergy & Precautions, NPO status   Airway Mallampati: II       Dental no notable dental hx. (+) Teeth Intact   Pulmonary Current Smoker,    Pulmonary exam normal breath sounds clear to auscultation       Cardiovascular hypertension, Pt. on medications Normal cardiovascular exam Rhythm:Regular Rate:Normal     Neuro/Psych negative psych ROS   GI/Hepatic negative GI ROS, Neg liver ROS,   Endo/Other  Morbid obesity  Renal/GU negative Renal ROS     Musculoskeletal   Abdominal (+) + obese,   Peds  Hematology  (+) anemia ,   Anesthesia Other Findings   Reproductive/Obstetrics                            Anesthesia Physical Anesthesia Plan  ASA: III  Anesthesia Plan: General   Post-op Pain Management:    Induction: Intravenous  PONV Risk Score and Plan: 4 or greater and Ondansetron, Dexamethasone, Scopolamine patch - Pre-op and Midazolam  Airway Management Planned: Oral ETT and Video Laryngoscope Planned  Additional Equipment:   Intra-op Plan:   Post-operative Plan: Extubation in OR  Informed Consent: I have reviewed the patients History and Physical, chart, labs and discussed the procedure including the risks, benefits and alternatives for the proposed anesthesia with the patient or authorized representative who has indicated his/her understanding and acceptance.   Dental advisory given  Plan Discussed with: CRNA and Surgeon  Anesthesia Plan Comments:         Anesthesia Quick Evaluation

## 2017-10-22 NOTE — Anesthesia Procedure Notes (Signed)
Procedure Name: Intubation Date/Time: 10/22/2017 2:11 PM Performed by: Marena ChancyBeckner, Ileene Allie S, CRNA Pre-anesthesia Checklist: Patient identified, Emergency Drugs available, Suction available and Patient being monitored Patient Re-evaluated:Patient Re-evaluated prior to induction Oxygen Delivery Method: Circle System Utilized Preoxygenation: Pre-oxygenation with 100% oxygen Induction Type: IV induction Ventilation: Mask ventilation without difficulty and Oral airway inserted - appropriate to patient size Laryngoscope Size: Hyacinth MeekerMiller and 2 Grade View: Grade I Tube type: Oral Tube size: 7.5 mm Number of attempts: 1 Airway Equipment and Method: Stylet and Oral airway Placement Confirmation: ETT inserted through vocal cords under direct vision,  positive ETCO2 and breath sounds checked- equal and bilateral Tube secured with: Tape Dental Injury: Teeth and Oropharynx as per pre-operative assessment

## 2017-10-22 NOTE — Transfer of Care (Signed)
Immediate Anesthesia Transfer of Care Note  Patient: Nicole Hurley  Procedure(s) Performed: LAPAROSCOPIC CHOLECYSTECTOMY WITH POSSIBLE INTRAOPERATIVE CHOLANGIOGRAM (N/A Abdomen)  Patient Location: PACU  Anesthesia Type:General  Level of Consciousness: awake, alert  and oriented  Airway & Oxygen Therapy: Patient Spontanous Breathing and Patient connected to face mask oxygen  Post-op Assessment: Report given to RN, Post -op Vital signs reviewed and stable and Patient moving all extremities X 4  Post vital signs: Reviewed and stable  Last Vitals:  Vitals Value Taken Time  BP 145/71 10/22/2017  3:50 PM  Temp    Pulse 97 10/22/2017  3:52 PM  Resp 26 10/22/2017  3:52 PM  SpO2 87 % 10/22/2017  3:52 PM  Vitals shown include unvalidated device data.  Last Pain:  Vitals:   10/22/17 1105  TempSrc:   PainSc: 9          Complications: No apparent anesthesia complications

## 2017-10-22 NOTE — Anesthesia Postprocedure Evaluation (Signed)
Anesthesia Post Note  Patient: Nicole Hurley  Procedure(s) Performed: LAPAROSCOPIC CHOLECYSTECTOMY WITH POSSIBLE INTRAOPERATIVE CHOLANGIOGRAM (N/A Abdomen)     Patient location during evaluation: PACU Anesthesia Type: General Level of consciousness: sedated Pain management: pain level controlled Vital Signs Assessment: post-procedure vital signs reviewed and stable Respiratory status: spontaneous breathing Cardiovascular status: stable Postop Assessment: no apparent nausea or vomiting Anesthetic complications: no    Last Vitals:  Vitals:   10/22/17 1550 10/22/17 1605  BP: (!) 145/71 (!) 148/92  Pulse: 97 95  Resp: 20 (!) 21  Temp: (!) 36.2 C   SpO2: 95% 98%    Last Pain:  Vitals:   10/22/17 1550  TempSrc:   PainSc: Asleep   Pain Goal:                 Kaleisha Bhargava JR,JOHN Yajahira Tison

## 2017-10-23 ENCOUNTER — Encounter (HOSPITAL_COMMUNITY): Payer: Self-pay | Admitting: Surgery

## 2017-10-23 MED ORDER — ACETAMINOPHEN 500 MG PO TABS: 1000.0000 mg | ORAL_TABLET | Freq: Three times a day (TID) | ORAL | 0 refills | Status: DC | PRN

## 2017-10-23 MED ORDER — METHOCARBAMOL 500 MG PO TABS
500.0000 mg | ORAL_TABLET | Freq: Four times a day (QID) | ORAL | Status: DC
  Administered 2017-10-23: 500 mg via ORAL
  Filled 2017-10-23: qty 1

## 2017-10-23 MED ORDER — OXYCODONE HCL 5 MG PO TABS: 5.0000 mg | ORAL_TABLET | Freq: Four times a day (QID) | ORAL | 0 refills | Status: DC | PRN

## 2017-10-23 MED ORDER — ACETAMINOPHEN 500 MG PO TABS
1000.0000 mg | ORAL_TABLET | Freq: Three times a day (TID) | ORAL | Status: DC
  Administered 2017-10-23: 1000 mg via ORAL
  Filled 2017-10-23: qty 2

## 2017-10-23 MED ORDER — METHOCARBAMOL 500 MG PO TABS: 500.0000 mg | ORAL_TABLET | Freq: Three times a day (TID) | ORAL | 0 refills | Status: DC | PRN

## 2017-10-23 MED ORDER — IBUPROFEN 600 MG PO TABS
600.0000 mg | ORAL_TABLET | Freq: Four times a day (QID) | ORAL | Status: DC
  Administered 2017-10-23: 600 mg via ORAL
  Filled 2017-10-23: qty 1

## 2017-10-23 MED ORDER — IBUPROFEN 600 MG PO TABS: 600.0000 mg | ORAL_TABLET | Freq: Four times a day (QID) | ORAL | 0 refills | Status: DC | PRN

## 2017-10-23 NOTE — Progress Notes (Signed)
Patient given discharge instructions, patient verbalized understanding. Patient left unit in stable condition via wheelchair with nursing staff.  

## 2017-10-23 NOTE — Progress Notes (Signed)
[facility] Progress Note  1 Day Post-Op  Subjective: CC: abdominal pain Patient states she has incisional pain, similar to when she had a c-section. Tolerating diet, passing flatus. Wants to eat real food.   Objective: Vital signs in last 24 hours: Temp:  [97.2 F (36.2 C)-98.6 F (37 C)] 98.3 F (36.8 C) (08/02 0558) Pulse Rate:  [67-98] 67 (08/02 0558) Resp:  [19-21] 19 (08/02 0558) BP: (112-148)/(63-92) 137/72 (08/02 0558) SpO2:  [91 %-99 %] 99 % (08/02 0558) Last BM Date: 10/20/17  Intake/Output from previous day: 08/01 0701 - 08/02 0700 In: 2081.2 [I.V.:1981.2; IV Piggyback:100] Out: 50 [Blood:50] Intake/Output this shift: Total I/O In: 100 [P.O.:100] Out: -   PE: Gen:  Alert, NAD, pleasant Card:  Regular rate and rhythm, pedal pulses 2+ BL Pulm:  Normal effort, clear to auscultation bilaterally Abd: Soft, appropriately tender, non-distended, incisions C/D/I Skin: warm and dry, no rashes  Psych: A&Ox3   Lab Results:  Recent Labs    10/20/17 1910  WBC 10.6*  HGB 10.1*  HCT 35.8*  PLT 469*   BMET Recent Labs    10/20/17 1910 10/22/17 0528  NA 137 139  K 3.8 3.9  CL 102 104  CO2 26 26  GLUCOSE 92 92  BUN 7 <5*  CREATININE 0.58 0.62  CALCIUM 9.2 8.6*   PT/INR No results for input(s): LABPROT, INR in the last 72 hours. CMP     Component Value Date/Time   NA 139 10/22/2017 0528   K 3.9 10/22/2017 0528   CL 104 10/22/2017 0528   CO2 26 10/22/2017 0528   GLUCOSE 92 10/22/2017 0528   BUN <5 (L) 10/22/2017 0528   CREATININE 0.62 10/22/2017 0528   CALCIUM 8.6 (L) 10/22/2017 0528   PROT 7.7 10/22/2017 0528   ALBUMIN 2.9 (L) 10/22/2017 0528   AST 17 10/22/2017 0528   ALT 24 10/22/2017 0528   ALKPHOS 84 10/22/2017 0528   BILITOT 0.5 10/22/2017 0528   GFRNONAA >60 10/22/2017 0528   GFRAA >60 10/22/2017 0528   Lipase     Component Value Date/Time   LIPASE 27 10/20/2017 1910       Studies/Results: No results  found.  Anti-infectives: Anti-infectives (From admission, onward)   Start     Dose/Rate Route Frequency Ordered Stop   10/21/17 0400  cefTRIAXone (ROCEPHIN) 2 g in sodium chloride 0.9 % 100 mL IVPB     2 g 200 mL/hr over 30 Minutes Intravenous Every 24 hours 10/21/17 [id number]         Assessment/Plan Morbid obesity  HTN  Cholecystitis  S/p laparoscopic cholecystectomy 10/22/17 Dr. Luisa Hartornett - POD#1 - tolerating CLD, passing flatus - advance to reg diet - mobilize - scheduled tylenol/ibuprofen/robaxin - may be ready to d/c this PM vs tomorrow AM  FEN: reg diet, SLIV VTE: SCDs, lovenox ID: rocephin 7/31>>    LOS: 2 days    Wells GuilesKelly Rayburn , [facility] 10/23/2017, 11:22 AM Pager: [phone] Consults: [phone] Mon-Fri 7:00 am-4:30 pm Sat-Sun 7:00 am-11:30 am

## 2017-10-23 NOTE — Discharge Summary (Signed)
[facility] Discharge Summary   Patient ID: Nicole Hurley MRN: [id number] DOB/AGE: 32-Jan-1987 31 y.o.  Admit date: 10/20/2017 Discharge date: 10/23/2017  Admitting Diagnosis: Acute cholecystitis  Discharge Diagnosis Patient Active Problem List   Diagnosis Date Noted  . Cholecystitis 10/21/2017    Consultants None  Imaging: No results found.  Procedures Dr. Luisa Hartornett (10/22/17) - Laparoscopic Cholecystectomy   Hospital Course:  Patient is a 32 year old [facility].  Workup showed acute cholecystitis.  Patient was admitted and underwent procedure listed above.  Tolerated procedure well and was transferred to the floor.  Diet was advanced as tolerated.  On POD#1, the patient was voiding well, tolerating diet, ambulating well, pain well controlled, vital signs stable, incisions c/d/i and felt stable for discharge home.  Patient will follow up in our office in 2 weeks and knows to call with questions or concerns. She will call to confirm appointment date/time.     Allergies as of 10/23/2017      Reactions   Benadryl [diphenhydramine Hcl (sleep)] Hives      Medication List    STOP taking these medications   HYDROcodone-acetaminophen 5-325 MG tablet Commonly known as:  NORCO/VICODIN     TAKE these medications   acetaminophen 500 MG tablet Commonly known as:  TYLENOL Take 2 tablets (1,000 mg total) by mouth every 8 (eight) hours as needed for mild pain.   amLODipine 5 MG tablet Commonly known as:  NORVASC Take 5 mg by mouth daily.   diclofenac sodium 1 % Gel Commonly known as:  VOLTAREN Apply 4 g topically 4 (four) times daily. What changed:    when to take this  reasons to take this   dicyclomine 20 MG tablet Commonly known as:  BENTYL Take 1 tablet (20 mg total) by mouth every 12 (twelve) hours as needed for spasms. Use for abdominal pain/cramping, as prescribed.   GAS RELIEF PO Take 1 tablet by mouth daily as  needed (gas).   ibuprofen 600 MG tablet Commonly known as:  ADVIL,MOTRIN Take 1 tablet (600 mg total) by mouth every 6 (six) hours as needed for mild pain.   metFORMIN 1000 MG tablet Commonly known as:  GLUCOPHAGE Take 1,000 mg by mouth 2 (two) times daily with a meal.   methocarbamol 500 MG tablet Commonly known as:  ROBAXIN Take 1 tablet (500 mg total) by mouth every 8 (eight) hours as needed for muscle spasms.   oxyCODONE 5 MG immediate release tablet Commonly known as:  Oxy IR/ROXICODONE Take 1 tablet (5 mg total) by mouth every 6 (six) hours as needed for moderate pain.   TUMS 500 MG chewable tablet Generic drug:  calcium carbonate Chew 500 mg by mouth daily as needed for indigestion or heartburn.        Follow-up Information    [facility] Go on 11/05/2017.   Specialty:  General Surgery Why:  Follow up appointment scheduled for 1:45 PM. Please arrive 30 min prior to appointment time. Bring photo ID and insurance information.  Contact information: [address] [place] [address] [phone]           Signed: Wells GuilesKelly Rayburn, [facility] 10/23/2017, 3:32 PM Pager: [phone] Consults: [phone] Mon-Fri 7:00 am-4:30 pm Sat-Sun 7:00 am-11:30 am

## 2017-12-02 ENCOUNTER — Ambulatory Visit: Payer: Medicaid Other | Admitting: Podiatry

## 2017-12-21 ENCOUNTER — Encounter: Payer: Medicaid Other | Admitting: Podiatry

## 2017-12-28 NOTE — Progress Notes (Signed)
This encounter was created in error - please disregard.

## 2018-02-22 ENCOUNTER — Ambulatory Visit (HOSPITAL_COMMUNITY)
Admission: EM | Admit: 2018-02-22 | Discharge: 2018-02-22 | Disposition: A | Discharge status: Home / Self Care | Payer: Medicaid Other | Attending: Physician Assistant | Admitting: Physician Assistant

## 2018-02-22 DIAGNOSIS — B9789 Other viral agents as the cause of diseases classified elsewhere: Secondary | ICD-10-CM

## 2018-02-22 DIAGNOSIS — J069 Acute upper respiratory infection, unspecified: Secondary | ICD-10-CM

## 2018-02-22 MED ORDER — BENZONATATE 100 MG PO CAPS: 100.0000 mg | ORAL_CAPSULE | Freq: Two times a day (BID) | ORAL | 0 refills | Status: DC | PRN

## 2018-02-22 MED ORDER — HYDROCODONE-HOMATROPINE 5-1.5 MG/5ML PO SYRP: 5.0000 mL | ORAL_SOLUTION | Freq: Four times a day (QID) | ORAL | 0 refills | Status: DC | PRN

## 2018-02-22 NOTE — Discharge Instructions (Addendum)
You have a virus. Drink plenty of water with OTC Mucinex 1200mg  every 12 hours. May take the cough tablets in the day and use the syrup at night to help you rest. No indication for an antibiotic at this time. If continues or worsens then follow up.

## 2018-02-22 NOTE — ED Notes (Signed)
Provider entered the room before they could be triaged.

## 2018-02-22 NOTE — ED Provider Notes (Signed)
[facility]    CSN: [id number] Arrival date & time: 02/22/18  1944     History   Chief Complaint No chief complaint on file.   HPI Berna SpareShaquanna D Silba is a 32 y.o. female.   Who presents with a 3 day history of dry cough. No fever or chills. No nasal symptoms. Soreness is noted and headache with cough. No known exposures. No malaise.      Past Medical History:  Diagnosis Date  . Gallstones 09/2017  . Hypertension     Patient Active Problem List   Diagnosis Date Noted  . Cholecystitis 10/21/2017    Past Surgical History:  Procedure Laterality Date  . CESAREAN SECTION    . CHOLECYSTECTOMY N/A 10/22/2017   Procedure: LAPAROSCOPIC CHOLECYSTECTOMY WITH POSSIBLE INTRAOPERATIVE CHOLANGIOGRAM;  Surgeon: Harriette Bouillonornett, Thomas, MD;  Location: [facility];  Service: General;  Laterality: N/A;  . DENTAL SURGERY      OB History   None      Home Medications    Prior to Admission medications   Medication Sig Start Date End Date Taking? Authorizing Provider  acetaminophen (TYLENOL) 500 MG tablet Take 2 tablets (1,000 mg total) by mouth every 8 (eight) hours as needed for mild pain. 10/23/17   Focht, Joyce CopaJessica L, PA  amLODipine (NORVASC) 5 MG tablet Take 5 mg by mouth daily.     [provider]  benzonatate (TESSALON) 100 MG capsule Take 1 capsule (100 mg total) by mouth 2 (two) times daily as needed for cough. 02/22/18   Riki SheerYoung, Jaylia Pettus G, PA-C  calcium carbonate (TUMS) 500 MG chewable tablet Chew 500 mg by mouth daily as needed for indigestion or heartburn.     [provider]  diclofenac sodium (VOLTAREN) 1 % GEL Apply 4 g topically 4 (four) times daily. Patient taking differently: Apply 4 g topically 4 (four) times daily as needed (pain/rash).  03/29/17   Gwyneth SproutPlunkett, Whitney, MD  dicyclomine (BENTYL) 20 MG tablet Take 1 tablet (20 mg total) by mouth every 12 (twelve) hours as needed for spasms. Use for abdominal pain/cramping, as prescribed. 10/19/17   Antony MaduraHumes, Kelly,  PA-C  HYDROcodone-homatropine (HYCODAN) 5-1.5 MG/5ML syrup Take 5 mLs by mouth every 6 (six) hours as needed for cough. 02/22/18   Riki SheerYoung, Cathern Tahir G, PA-C  ibuprofen (ADVIL,MOTRIN) 600 MG tablet Take 1 tablet (600 mg total) by mouth every 6 (six) hours as needed for mild pain. 10/23/17   Focht, Joyce CopaJessica L, PA  metFORMIN (GLUCOPHAGE) 1000 MG tablet Take 1,000 mg by mouth 2 (two) times daily with a meal.    [provider]  methocarbamol (ROBAXIN) 500 MG tablet Take 1 tablet (500 mg total) by mouth every 8 (eight) hours as needed for muscle spasms. 10/23/17   Focht, Joyce CopaJessica L, PA  oxyCODONE (OXY IR/ROXICODONE) 5 MG immediate release tablet Take 1 tablet (5 mg total) by mouth every 6 (six) hours as needed for moderate pain. 10/23/17   Focht, Joyce CopaJessica L, PA  Simethicone (GAS RELIEF PO) Take 1 tablet by mouth daily as needed (gas).    [provider]    Family History No family history on file.  Social History Social History   Tobacco Use  . Smoking status: Current Every Day Smoker    Types: Cigarettes  . Smokeless tobacco: Never Used  Substance Use Topics  . Alcohol use: Yes  . Drug use: No     Allergies   Benadryl [diphenhydramine hcl (sleep)]   Review of Systems Review of Systems  Constitutional: Positive for fatigue. Negative for fever.  Respiratory: Negative for shortness of breath and wheezing.   Cardiovascular: Positive for chest pain.       Soreness with cough  Hematological: Negative.      Physical Exam Triage Vital Signs ED Triage Vitals [02/22/18 2020]  Enc Vitals Group     BP 127/65     Pulse Rate (!) 103     Resp 18     Temp 98 F (36.7 C)     Temp Source Oral     SpO2 100 %     Weight      Height      Head Circumference      Peak Flow      Pain Score      Pain Loc      Pain Edu?      Excl. in GC?    No data found.  Updated Vital Signs BP 127/65 (BP Location: Left Arm)   Pulse (!) 103   Temp 98 F (36.7 C) (Oral)   Resp 18   SpO2  100%   Visual Acuity Right Eye Distance:   Left Eye Distance:   Bilateral Distance:    Right Eye Near:   Left Eye Near:    Bilateral Near:     Physical Exam  Constitutional: She is oriented to person, place, and time. She appears well-developed and well-nourished. No distress.  HENT:  Head: Normocephalic and atraumatic.  Nose: Nose normal.  Mouth/Throat: Oropharynx is clear and moist.  Cardiovascular: Regular rhythm.  Pulmonary/Chest: Effort normal and breath sounds normal. She has no wheezes. She has no rales.  Neurological: She is alert and oriented to person, place, and time.  Skin: Skin is warm and dry. She is not diaphoretic.  Psychiatric: Her behavior is normal.  Nursing note and vitals reviewed.    UC Treatments / Results  Labs (all labs ordered are listed, but only abnormal results are displayed) Labs Reviewed - No data to display  EKG None  Radiology No results found.  Procedures Procedures (including critical care time)  Medications Ordered in UC Medications - No data to display  Initial Impression / Assessment and Plan / UC Course  I have reviewed the triage vital signs and the nursing notes.  Pertinent labs & imaging results that were available during my care of the patient were reviewed by me and considered in my medical decision making (see chart for details).     Viral URI. No indication for antibiotic. Treat symptomatically with fluids and cough suppressant. FU if worsens.  Final Clinical Impressions(s) / UC Diagnoses   Final diagnoses:  Viral URI with cough     Discharge Instructions     You have a virus. Drink plenty of water with OTC Mucinex 1200mg  every 12 hours. May take the cough tablets in the day and use the syrup at night to help you rest. No indication for an antibiotic at this time. If continues or worsens then follow up.    ED Prescriptions    Medication Sig Dispense Auth. Provider   HYDROcodone-homatropine (HYCODAN) 5-1.5  MG/5ML syrup Take 5 mLs by mouth every 6 (six) hours as needed for cough. 140 mL Darneshia Demary G, PA-C   benzonatate (TESSALON) 100 MG capsule Take 1 capsule (100 mg total) by mouth 2 (two) times daily as needed for cough. 21 capsule Riki Sheer, PA-C     Controlled Substance Prescriptions [place] Controlled Substance Registry consulted? Not Applicable  Riki Sheer, [place] 02/22/18 2028

## 2018-12-23 ENCOUNTER — Encounter (HOSPITAL_COMMUNITY): Payer: Self-pay

## 2018-12-23 ENCOUNTER — Ambulatory Visit (HOSPITAL_COMMUNITY)
Admission: EM | Admit: 2018-12-23 | Discharge: 2018-12-23 | Disposition: A | Discharge status: Home / Self Care | Payer: Medicaid Other | Attending: Emergency Medicine | Admitting: Emergency Medicine

## 2018-12-23 ENCOUNTER — Ambulatory Visit (INDEPENDENT_AMBULATORY_CARE_PROVIDER_SITE_OTHER): Payer: Medicaid Other

## 2018-12-23 ENCOUNTER — Other Ambulatory Visit: Payer: Self-pay

## 2018-12-23 DIAGNOSIS — J189 Pneumonia, unspecified organism: Secondary | ICD-10-CM

## 2018-12-23 MED ORDER — IBUPROFEN 600 MG PO TABS: 600.0000 mg | ORAL_TABLET | Freq: Four times a day (QID) | ORAL | 0 refills | Status: DC | PRN

## 2018-12-23 MED ORDER — AZITHROMYCIN 250 MG PO TABS: 250.0000 mg | ORAL_TABLET | Freq: Every day | ORAL | 0 refills | Status: DC

## 2018-12-23 MED ORDER — KETOROLAC TROMETHAMINE 30 MG/ML IJ SOLN
INTRAMUSCULAR | Status: AC
  Filled 2018-12-23: qty 1

## 2018-12-23 MED ORDER — AMOXICILLIN 500 MG PO TABS: 1000.0000 mg | ORAL_TABLET | Freq: Three times a day (TID) | ORAL | 0 refills | Status: AC

## 2018-12-23 MED ORDER — KETOROLAC TROMETHAMINE 30 MG/ML IJ SOLN
30.0000 mg | Freq: Once | INTRAMUSCULAR | Status: AC
  Administered 2018-12-23: 30 mg via INTRAMUSCULAR

## 2018-12-23 MED ORDER — TIZANIDINE HCL 4 MG PO TABS: 4.0000 mg | ORAL_TABLET | Freq: Three times a day (TID) | ORAL | 0 refills | Status: DC | PRN

## 2018-12-23 NOTE — Discharge Instructions (Addendum)
Finish the amoxicillin and azithromycin, even if you feel better.  Take 600 mg of ibuprofen combined with 1 g of Tylenol 3-4 times a day as needed for pain.  You will need a repeat chest x-ray in 3 or 4 weeks to make sure that this is healed and to make sure that this is not something else.

## 2018-12-23 NOTE — ED Triage Notes (Signed)
Pt states she has a has sharp pain in the upper left side of her back. Pt states this pain is constant. This has been going on for 2 weeks or more.

## 2018-12-23 NOTE — ED Provider Notes (Signed)
HPI  SUBJECTIVE:  Nicole Hurley is a 33 y.o. female who presents with 2 weeks of nonmigratory, nonradiating constant sharp left scapular pain.  She has been taking Tylenol thousand milligrams every 6 hours, tried heat and ice without improvement in her symptoms.  Symptoms are worse with deep inspiration, or movement.  No fevers, coughing, wheezing, shortness of breath.  No fall, trauma to the area, change in physical activity.  No chest pain.  No abdominal pain.  No syncope.  No rash.  No calf pain or swelling, hemoptysis, surgery in the past 4 weeks, trauma, immobilization, exogenous estrogen.  No recent illness, pneumonia.  No exertional component.  No nausea, diaphoresis.  She took Tylenol within 4 to 6 hours of evaluation.  She has a past medical history of hypertension, asthma that she "grew out of".  She is a borderline diabetic.  She continues to smoke.  No history of PE, DVT, cancer, pneumothorax, scapular injury.  No history of chronic kidney disease.  No antibiotics in the past 3 months.  LMP: 9/23.  Denies the possibility of being pregnant.  PMD: [facility]   Past Medical History:  Diagnosis Date  . Gallstones 09/2017  . Hypertension     Past Surgical History:  Procedure Laterality Date  . CESAREAN SECTION    . CHOLECYSTECTOMY N/A 10/22/2017   Procedure: LAPAROSCOPIC CHOLECYSTECTOMY WITH POSSIBLE INTRAOPERATIVE CHOLANGIOGRAM;  Surgeon: Erroll Luna, MD;  Location: [place];  Service: General;  Laterality: N/A;  . DENTAL SURGERY      History reviewed. No pertinent family history.  Social History   Tobacco Use  . Smoking status: Current Every Day Smoker    Types: Cigarettes  . Smokeless tobacco: Never Used  Substance Use Topics  . Alcohol use: Yes  . Drug use: No    No current facility-administered medications for this encounter.   Current Outpatient Medications:  .  acetaminophen (TYLENOL) 500 MG tablet, Take 2 tablets (1,000 mg total) by  mouth every 8 (eight) hours as needed for mild pain., Disp: 30 tablet, Rfl: 0 .  amLODipine (NORVASC) 5 MG tablet, Take 5 mg by mouth daily. , Disp: , Rfl:  .  amoxicillin (AMOXIL) 500 MG tablet, Take 2 tablets (1,000 mg total) by mouth 3 (three) times daily for 5 days., Disp: 30 tablet, Rfl: 0 .  azithromycin (ZITHROMAX) 250 MG tablet, Take 1 tablet (250 mg total) by mouth daily. 2 tabs po on day 1, 1 tab po on days 2-5, Disp: 6 tablet, Rfl: 0 .  benzonatate (TESSALON) 100 MG capsule, Take 1 capsule (100 mg total) by mouth 2 (two) times daily as needed for cough., Disp: 21 capsule, Rfl: 0 .  calcium carbonate (TUMS) 500 MG chewable tablet, Chew 500 mg by mouth daily as needed for indigestion or heartburn. , Disp: , Rfl:  .  diclofenac sodium (VOLTAREN) 1 % GEL, Apply 4 g topically 4 (four) times daily. (Patient taking differently: Apply 4 g topically 4 (four) times daily as needed (pain/rash). ), Disp: 100 g, Rfl: 0 .  dicyclomine (BENTYL) 20 MG tablet, Take 1 tablet (20 mg total) by mouth every 12 (twelve) hours as needed for spasms. Use for abdominal pain/cramping, as prescribed., Disp: 20 tablet, Rfl: 0 .  ibuprofen (ADVIL) 600 MG tablet, Take 1 tablet (600 mg total) by mouth every 6 (six) hours as needed., Disp: 30 tablet, Rfl: 0 .  metFORMIN (GLUCOPHAGE) 1000 MG tablet, Take 1,000 mg by mouth 2 (two)  times daily with a meal., Disp: , Rfl:  .  methocarbamol (ROBAXIN) 500 MG tablet, Take 1 tablet (500 mg total) by mouth every 8 (eight) hours as needed for muscle spasms., Disp: 30 tablet, Rfl: 0 .  oxyCODONE (OXY IR/ROXICODONE) 5 MG immediate release tablet, Take 1 tablet (5 mg total) by mouth every 6 (six) hours as needed for moderate pain., Disp: 15 tablet, Rfl: 0 .  Simethicone (GAS RELIEF PO), Take 1 tablet by mouth daily as needed (gas)., Disp: , Rfl:   Allergies  Allergen Reactions  . Benadryl [Diphenhydramine Hcl (Sleep)] Hives     ROS  As noted in HPI.   Physical Exam  BP (!)  142/84 (BP Location: Left Arm)   Pulse 88   Temp 98 F (36.7 C) (Temporal)   Resp 16   Wt (!) 190.5 kg   LMP 12/15/2018 (Exact Date)   SpO2 100%   BMI 69.89 kg/m   Constitutional: Well developed, well nourished, no acute distress.  Morbidly obese. Eyes:  EOMI, conjunctiva normal bilaterally HENT: Normocephalic, atraumatic,mucus membranes moist Respiratory: Poor inspiratory effort, faint breath sounds, lungs clear bilaterally. Cardiovascular: Normal rate, regular rhythm, no murmurs rubs or gallops GI: nondistended skin: No rash over area of tenderness, skin intact Musculoskeletal: No C, T spine tenderness.  No rash, bruising, erythema over area of tenderness.  Positive tenderness over left rhomboid, scapula.  pain aggravated with torso rotation to the left, rowing motion.  She has no tenderness over the trapezius, entire shoulder.  She has full AROM of the shoulders bilaterally.  Grip strength 5/5 and equal bilaterally.  Sensation upper extremities grossly intact and equal.  No lower extremity edema, calf tenderness.  No palpable cord.  Calves symmetric. Neurologic: Alert & oriented x 3, no focal neuro deficits Psychiatric: Speech and behavior appropriate   ED Course   Medications  ketorolac (TORADOL) 30 MG/ML injection 30 mg (30 mg Intramuscular Given 12/23/18 1816)  ketorolac (TORADOL) 30 MG/ML injection (has no administration in time range)    Orders Placed This Encounter  Procedures  . DG Chest 2 View    Standing Status:   Standing    Number of Occurrences:   1    Order Specific Question:   Reason for Exam (SYMPTOM  OR DIAGNOSIS REQUIRED)    Answer:   scapular pain tenderness, pain with inspiration,  r/o PTX PNA    No results found for this or any previous visit (from the past 24 hour(s)). Dg Chest 2 View  Result Date: 12/23/2018 CLINICAL DATA:  33 year old female with history of scapular pain and tenderness. Pain with inspiration. EXAM: CHEST - 2 VIEW COMPARISON:  No  priors. FINDINGS: Ill-defined opacity in the left lower lobe concerning for airspace consolidation. Right lung is clear. No pleural effusions. No evidence of pulmonary edema. Heart size is upper limits of normal. Upper mediastinal contours are within normal limits. IMPRESSION: 1. Ill-defined opacity in the left lower lobe concerning for airspace consolidation from pneumonia. Followup PA and lateral chest X-ray is recommended in 3-4 weeks following trial of antibiotic therapy to ensure resolution and exclude underlying malignancy. Electronically Signed   By: Trudie Reed M.D.   On: 12/23/2018 18:55    ED Clinical Impression  1. Pneumonia of left lower lobe due to infectious organism      ED Assessment/Plan  Patient is PERC negative.  Vitals are normal.  She does have muscular tenderness and it is aggravated with movement.  However she is describing pleuritic  pain, so will get a chest x-ray to rule out pneumonia, pneumothorax.  Doubt ACS, there is no exertional component, she denies chest pain.  Doubt dissection.  Reviewed imaging independently.  No pneumothorax.  Ill-defined opacity in the left lower lobe concerning for airspace consolidation.  Recommend follow-up x-ray in 3 to 4 weeks.  See radiology report for full details.  Patient with a left-sided pneumonia.  Will send home with amoxicillin 1 g 3 times daily plus azithromycin Z-Pak for 5 days.  Also sent home with ibuprofen 600 mg combined with 1 g of Tylenol 3-4 times a day as needed for pain.  She denies coughing, shortness of breath, so do not think the bronchodilators are needed.  Will advise her to have a follow-up chest x-ray in 3 to 4 weeks to ensure resolution and to exclude malignancy.  She will need to follow-up with her primary care physician for this.  Zanaflex because it appears to bea musculoskeletal component to this.   Discussed imaging, MDM, treatment plan, and plan for follow-up with patient. Discussed sn/sx that should  prompt return to the ED. patient agrees with plan.   Meds ordered this encounter  Medications  . ketorolac (TORADOL) 30 MG/ML injection 30 mg  . azithromycin (ZITHROMAX) 250 MG tablet    Sig: Take 1 tablet (250 mg total) by mouth daily. 2 tabs po on day 1, 1 tab po on days 2-5    Dispense:  6 tablet    Refill:  0  . amoxicillin (AMOXIL) 500 MG tablet    Sig: Take 2 tablets (1,000 mg total) by mouth 3 (three) times daily for 5 days.    Dispense:  30 tablet    Refill:  0  . ibuprofen (ADVIL) 600 MG tablet    Sig: Take 1 tablet (600 mg total) by mouth every 6 (six) hours as needed.    Dispense:  30 tablet    Refill:  0    *This clinic note was created using Scientist, clinical (histocompatibility and immunogenetics)Dragon dictation software. Therefore, there may be occasional mistakes despite careful proofreading.   ?   Domenick GongMortenson, Charliee Krenz, MD 12/23/18 1935

## 2018-12-25 ENCOUNTER — Emergency Department (HOSPITAL_COMMUNITY): Payer: Medicaid Other

## 2018-12-25 ENCOUNTER — Encounter (HOSPITAL_COMMUNITY): Payer: Self-pay | Admitting: Emergency Medicine

## 2018-12-25 ENCOUNTER — Emergency Department (HOSPITAL_COMMUNITY)
Admission: EM | Admit: 2018-12-25 | Discharge: 2018-12-25 | Disposition: A | Discharge status: Home / Self Care | Payer: Medicaid Other | Attending: Emergency Medicine | Admitting: Emergency Medicine

## 2018-12-25 DIAGNOSIS — F17210 Nicotine dependence, cigarettes, uncomplicated: Secondary | ICD-10-CM | POA: Insufficient documentation

## 2018-12-25 DIAGNOSIS — M898X1 Other specified disorders of bone, shoulder: Secondary | ICD-10-CM | POA: Insufficient documentation

## 2018-12-25 DIAGNOSIS — R0602 Shortness of breath: Secondary | ICD-10-CM | POA: Diagnosis present

## 2018-12-25 DIAGNOSIS — Z79899 Other long term (current) drug therapy: Secondary | ICD-10-CM | POA: Diagnosis not present

## 2018-12-25 DIAGNOSIS — I10 Essential (primary) hypertension: Secondary | ICD-10-CM | POA: Insufficient documentation

## 2018-12-25 DIAGNOSIS — Z20828 Contact with and (suspected) exposure to other viral communicable diseases: Secondary | ICD-10-CM | POA: Insufficient documentation

## 2018-12-25 DIAGNOSIS — Z7984 Long term (current) use of oral hypoglycemic drugs: Secondary | ICD-10-CM | POA: Diagnosis not present

## 2018-12-25 LAB — CBC WITH DIFFERENTIAL/PLATELET
Abs Immature Granulocytes: 0.03 10*3/uL (ref 0.00–0.07)
Basophils Absolute: 0 10*3/uL (ref 0.0–0.1)
Basophils Relative: 0 %
Eosinophils Absolute: 0.3 10*3/uL (ref 0.0–0.5)
Eosinophils Relative: 3 %
HCT: 31.8 % — ABNORMAL LOW (ref 36.0–46.0)
Hemoglobin: 9 g/dL — ABNORMAL LOW (ref 12.0–15.0)
Immature Granulocytes: 0 %
Lymphocytes Relative: 26 %
Lymphs Abs: 2.3 10*3/uL (ref 0.7–4.0)
MCH: 21 pg — ABNORMAL LOW (ref 26.0–34.0)
MCHC: 28.3 g/dL — ABNORMAL LOW (ref 30.0–36.0)
MCV: 74.1 fL — ABNORMAL LOW (ref 80.0–100.0)
Monocytes Absolute: 0.3 10*3/uL (ref 0.1–1.0)
Monocytes Relative: 4 %
Neutro Abs: 6.1 10*3/uL (ref 1.7–7.7)
Neutrophils Relative %: 67 %
Platelets: 456 10*3/uL — ABNORMAL HIGH (ref 150–400)
RBC: 4.29 MIL/uL (ref 3.87–5.11)
RDW: 20.9 % — ABNORMAL HIGH (ref 11.5–15.5)
WBC: 9.1 10*3/uL (ref 4.0–10.5)
nRBC: 0 % (ref 0.0–0.2)

## 2018-12-25 LAB — BASIC METABOLIC PANEL
Anion gap: 9 (ref 5–15)
BUN: 6 mg/dL (ref 6–20)
CO2: 24 mmol/L (ref 22–32)
Calcium: 8.9 mg/dL (ref 8.9–10.3)
Chloride: 103 mmol/L (ref 98–111)
Creatinine, Ser: 0.5 mg/dL (ref 0.44–1.00)
GFR calc Af Amer: >60 mL/min (ref >60)
GFR calc non Af Amer: >60 mL/min (ref >60)
Glucose, Bld: 82 mg/dL (ref 70–99)
Potassium: 4.4 mmol/L (ref 3.5–5.1)
Sodium: 136 mmol/L (ref 135–145)

## 2018-12-25 LAB — D-DIMER, QUANTITATIVE: D-Dimer, Quant: 0.38 ug/mL-FEU (ref 0.00–0.50)

## 2018-12-25 LAB — TROPONIN I (HIGH SENSITIVITY): Troponin I (High Sensitivity): 6 ng/L (ref <18)

## 2018-12-25 MED ORDER — FENTANYL CITRATE (PF) 100 MCG/2ML IJ SOLN
50.0000 ug | Freq: Once | INTRAMUSCULAR | Status: AC
  Administered 2018-12-25: 13:00:00 50 ug via INTRAVENOUS
  Filled 2018-12-25: qty 2

## 2018-12-25 MED ORDER — KETOROLAC TROMETHAMINE 15 MG/ML IJ SOLN
15.0000 mg | Freq: Once | INTRAMUSCULAR | Status: AC
  Administered 2018-12-25: 15 mg via INTRAVENOUS
  Filled 2018-12-25: qty 1

## 2018-12-25 MED ORDER — METHOCARBAMOL 500 MG PO TABS: 500.0000 mg | ORAL_TABLET | Freq: Three times a day (TID) | ORAL | 0 refills | Status: DC | PRN

## 2018-12-25 MED ORDER — LIDOCAINE 5 % EX PTCH: 1.0000 | MEDICATED_PATCH | CUTANEOUS | 0 refills | Status: DC

## 2018-12-25 MED ORDER — NAPROXEN 500 MG PO TABS: 500.0000 mg | ORAL_TABLET | Freq: Two times a day (BID) | ORAL | 0 refills | Status: DC

## 2018-12-25 NOTE — ED Provider Notes (Signed)
[facility] DEPARTMENT Provider Note   CSN: [id number] Arrival date & time: 12/25/18  1142     History   Chief Complaint Chief Complaint  Patient presents with   Pleurisy   Shortness of Breath    HPI Nicole Hurley is a 33 y.o. female with a hx of tobacco abuse, HTN, & prior cholecystectomy who presents to the ED w/ complaints of left scapular pain x 2 weeks.  Patient describes the pain as constant sharp discomfort in the left scapular region, currently a 10 out of 10, worse with a deep breath and with movement and palpation, no alleviating factors.  She cannot recall a specific traumatic injury or significant change in activity prior to onset of pain but did start a new job.  States that she feels a bit short of breath with deep breaths but this may just be more pain related she is unsure.  She was seen at urgent care for her symptoms 12/23/2018, had chest x-ray suspicious for pneumonia, was placed on amoxicillin 1 g 3 times daily as well as a 5-day Z-Pak.  She was also sent home with ibuprofen 600 mg to take with Tylenol per over-the-counter dosing.  She has been taking all medication as prescribed without relief.  Denies fever, chills, URI symptoms, cough, hemoptysis, leg pain/swelling,  recent surgery/trauma, recent long travel, hormone use, personal hx of cancer, or hx of DVT/PE.    HPI  Past Medical History:  Diagnosis Date   Gallstones 09/2017   Hypertension     Patient Active Problem List   Diagnosis Date Noted   Cholecystitis 10/21/2017    Past Surgical History:  Procedure Laterality Date   CESAREAN SECTION     CHOLECYSTECTOMY N/A 10/22/2017   Procedure: LAPAROSCOPIC CHOLECYSTECTOMY WITH POSSIBLE INTRAOPERATIVE CHOLANGIOGRAM;  Surgeon: Erroll Luna, MD;  Location: [facility];  Service: General;  Laterality: N/A;   DENTAL SURGERY       OB History   No obstetric history on file.      Home Medications    Prior to Admission  medications   Medication Sig Start Date End Date Taking? Authorizing Provider  acetaminophen (TYLENOL) 500 MG tablet Take 2 tablets (1,000 mg total) by mouth every 8 (eight) hours as needed for mild pain. 10/23/17   Focht, Fraser Din, PA  amLODipine (NORVASC) 5 MG tablet Take 5 mg by mouth daily.     [provider]  amoxicillin (AMOXIL) 500 MG tablet Take 2 tablets (1,000 mg total) by mouth 3 (three) times daily for 5 days. 12/23/18 12/28/18  Melynda Ripple, MD  azithromycin (ZITHROMAX) 250 MG tablet Take 1 tablet (250 mg total) by mouth daily. 2 tabs po on day 1, 1 tab po on days 2-5 12/23/18   Melynda Ripple, MD  benzonatate (TESSALON) 100 MG capsule Take 1 capsule (100 mg total) by mouth 2 (two) times daily as needed for cough. 02/22/18   Bjorn Pippin, PA-C  calcium carbonate (TUMS) 500 MG chewable tablet Chew 500 mg by mouth daily as needed for indigestion or heartburn.     [provider]  diclofenac sodium (VOLTAREN) 1 % GEL Apply 4 g topically 4 (four) times daily. Patient taking differently: Apply 4 g topically 4 (four) times daily as needed (pain/rash).  03/29/17   Blanchie Dessert, MD  dicyclomine (BENTYL) 20 MG tablet Take 1 tablet (20 mg total) by mouth every 12 (twelve) hours as needed for spasms. Use for abdominal pain/cramping, as prescribed. 10/19/17  Antony Madura, PA-C  ibuprofen (ADVIL) 600 MG tablet Take 1 tablet (600 mg total) by mouth every 6 (six) hours as needed. 12/23/18   Domenick Gong, MD  metFORMIN (GLUCOPHAGE) 1000 MG tablet Take 1,000 mg by mouth 2 (two) times daily with a meal.    [provider]  methocarbamol (ROBAXIN) 500 MG tablet Take 1 tablet (500 mg total) by mouth every 8 (eight) hours as needed for muscle spasms. 10/23/17   Focht, Joyce Copa, PA  oxyCODONE (OXY IR/ROXICODONE) 5 MG immediate release tablet Take 1 tablet (5 mg total) by mouth every 6 (six) hours as needed for moderate pain. 10/23/17   Focht, Joyce Copa, PA  Simethicone  (GAS RELIEF PO) Take 1 tablet by mouth daily as needed (gas).    [provider]  tiZANidine (ZANAFLEX) 4 MG tablet Take 1 tablet (4 mg total) by mouth every 8 (eight) hours as needed for muscle spasms. 12/23/18   Domenick Gong, MD    Family History History reviewed. No pertinent family history.  Social History Social History   Tobacco Use   Smoking status: Current Every Day Smoker    Types: Cigarettes   Smokeless tobacco: Never Used  Substance Use Topics   Alcohol use: Yes   Drug use: No     Allergies   Benadryl [diphenhydramine hcl (sleep)]   Review of Systems Review of Systems  Constitutional: Negative for chills and fever.  HENT: Negative for congestion, ear pain and sore throat.   Eyes: Negative for visual disturbance.  Respiratory: Positive for shortness of breath.   Cardiovascular: Negative for chest pain, palpitations and leg swelling.  Gastrointestinal: Negative for abdominal pain, constipation, diarrhea, nausea and vomiting.  Musculoskeletal: Positive for back pain. Negative for myalgias.  Neurological: Negative for weakness and numbness.  All other systems reviewed and are negative.    Physical Exam Updated Vital Signs BP (!) 148/66 (BP Location: Left Arm)    Pulse 76    Temp 97.8 F (36.6 C) (Oral)    Resp 16    LMP 12/15/2018 (Exact Date)    SpO2 97%   Physical Exam Vitals signs and nursing note reviewed.  Constitutional:      General: She is not in acute distress.    Appearance: She is well-developed. She is not toxic-appearing.  HENT:     Head: Normocephalic and atraumatic.  Eyes:     General:        Right eye: No discharge.        Left eye: No discharge.     Conjunctiva/sclera: Conjunctivae normal.  Neck:     Musculoskeletal: Neck supple.  Cardiovascular:     Rate and Rhythm: Normal rate and regular rhythm.     Comments: 2+ symmetric radial pulses. Pulmonary:     Effort: Pulmonary effort is normal. No respiratory distress.       Breath sounds: Normal breath sounds. No wheezing, rhonchi or rales.  Chest:     Chest wall: Tenderness (Left scapular region without overlying skin changes or palpable crepitus.) present.  Abdominal:     General: There is no distension.     Palpations: Abdomen is soft.     Tenderness: There is no abdominal tenderness.  Musculoskeletal:     Right lower leg: She exhibits no tenderness. No edema.     Left lower leg: She exhibits no tenderness. No edema.     Comments: Intact active range of motion throughout bilateral upper and lower extremities without point/focal bony tenderness  other than the scapular tenderness mentioned in chest wall exam.  Skin:    General: Skin is warm and dry.     Findings: No rash.  Neurological:     Mental Status: She is alert.     Comments: Clear speech.  Sensation grossly intact bilateral upper extremities.  5 out of 5 symmetric grip strength.  Psychiatric:        Behavior: Behavior normal.    ED Treatments / Results  Labs (all labs ordered are listed, but only abnormal results are displayed) Labs Reviewed  CBC WITH DIFFERENTIAL/PLATELET - Abnormal; Notable for the following components:      Result Value   Hemoglobin 9.0 (*)    HCT 31.8 (*)    MCV 74.1 (*)    MCH 21.0 (*)    MCHC 28.3 (*)    RDW 20.9 (*)    Platelets 456 (*)    All other components within normal limits  NOVEL CORONAVIRUS, NAA (HOSP ORDER, SEND-OUT TO REF LAB; TAT 18-24 HRS)  BASIC METABOLIC PANEL  D-DIMER, QUANTITATIVE (NOT AT [facility])  TROPONIN I (HIGH SENSITIVITY)    EKG EKG Interpretation  Date/Time:  Saturday December 25 2018 11:58:52 EDT Ventricular Rate:  88 PR Interval:  152 QRS Duration: 86 QT Interval:  392 QTC Calculation: 474 R Axis:   73 Text Interpretation:  Normal sinus rhythm Normal ECG Confirmed by Marianna Fussykstra, Richard ([id number]) on 12/25/2018 12:07:26 PM   Radiology Dg Chest 2 View  Result Date: 12/25/2018 CLINICAL DATA:  Shortness of breath.  Recent  diagnosis of pneumonia. EXAM: CHEST - 2 VIEW COMPARISON:  12/23/2018 FINDINGS: Lungs are adequately inflated demonstrate mild hazy opacification over the medial right base and posterior lung bases on the lateral film without significant change suggesting infection. No effusion. Cardiomediastinal silhouette is within normal. Mild prominence of the central bronchovascular markings unchanged. Remainder of the exam is unchanged. IMPRESSION: Subtle stable hazy airspace process over the posterior lung bases on the lateral film possibly over the medial right base on the frontal film likely infection. Electronically Signed   By: Elberta Fortisaniel  Boyle M.D.   On: 12/25/2018 12:31   Dg Chest 2 View  Result Date: 12/23/2018 CLINICAL DATA:  33 year old female with history of scapular pain and tenderness. Pain with inspiration. EXAM: CHEST - 2 VIEW COMPARISON:  No priors. FINDINGS: Ill-defined opacity in the left lower lobe concerning for airspace consolidation. Right lung is clear. No pleural effusions. No evidence of pulmonary edema. Heart size is upper limits of normal. Upper mediastinal contours are within normal limits. IMPRESSION: 1. Ill-defined opacity in the left lower lobe concerning for airspace consolidation from pneumonia. Followup PA and lateral chest X-ray is recommended in 3-4 weeks following trial of antibiotic therapy to ensure resolution and exclude underlying malignancy. Electronically Signed   By: Trudie Reedaniel  Entrikin M.D.   On: 12/23/2018 18:55    Procedures Procedures (including critical care time)  Medications Ordered in ED Medications  ketorolac (TORADOL) 15 MG/ML injection 15 mg (has no administration in time range)  fentaNYL (SUBLIMAZE) injection 50 mcg (50 mcg Intravenous Given 12/25/18 1302)     Initial Impression / Assessment and Plan / ED Course  I have reviewed the triage vital signs and the nursing notes.  Pertinent labs & imaging results that were available during my care of the patient  were reviewed by me and considered in my medical decision making (see chart for details).   Patient presents to the emergency department for persistent left scapular pain  for the past 2 weeks.  Patient nontoxic-appearing, resting comfortably, vitals without significant abnormality, BP somewhat elevated, doubt HTN emergency.  Heart with regular rate and rhythm, lungs clear to auscultation bilaterally, she does have some reproducibility of her pain with left scapular palpation.  Given this is patient's second visit for same without improvement will obtain repeat chest x-ray, EKG, basic labs, one-time troponin, and d-dimer.  CBC: No leukocytosis, anemia somewhat similar to prior, will need PCP recheck BMP: No electrolyte derangement.  Renal function preserved. D-dimer: WNL Troponin: 6 EKG: NSR, no STEMI Chest x-ray:  Subtle stable hazy airspace process over the posterior lung bases on the lateral film possibly over the medial right base on the frontal film likely infection.  Patient with persistent pain to the left scapular region for 1 week, (>6 hours), troponin 6, EKG w/ No concerning ischemic changes- doubt ACS. Low risk wells- d-dimer WNL- doubt PE. No widened mediastinum, symmetric pulses, doubt dissection. No pneumothorax. CXR w/ concerns for infection- patient currently on appropriate CAP coverage, somewhat atypical without fever or cough, but will have her complete abx given chest xray findings, also will obtain covid swab. Seems more MSK in origin given reproducibility with palpation, DC ibuprofen by UC- start naproxen, robaxin, & lidoderm patches. Discussed no driving/operating heavy machinery with robaxin. PCP follow up. I discussed results, treatment plan, need for follow-up, and return precautions with the patient. Provided opportunity for questions, patient confirmed understanding and is in agreement with plan.   Findings and plan of care discussed with supervising physician Dr. Stevie Kern who  is in agreement.   LASHICA HANNAY was evaluated in Emergency Department on 12/25/2018 for the symptoms described in the history of present illness. He/she was evaluated in the context of the global COVID-19 pandemic, which necessitated consideration that the patient might be at risk for infection with the SARS-CoV-2 virus that causes COVID-19. Institutional protocols and algorithms that pertain to the evaluation of patients at risk for COVID-19 are in a state of rapid change based on information released by regulatory bodies including the CDC and federal and state organizations. These policies and algorithms were followed during the patient's care in the ED.  Final Clinical Impressions(s) / ED Diagnoses   Final diagnoses:  Pain in scapula    ED Discharge Orders         Ordered    naproxen (NAPROSYN) 500 MG tablet  2 times daily     12/25/18 1435    methocarbamol (ROBAXIN) 500 MG tablet  Every 8 hours PRN     12/25/18 1435    lidocaine (LIDODERM) 5 %  Every 24 hours     12/25/18 1435           Boyce Keltner, [place] R, PA-C 12/25/18 1450    Milagros Loll, MD 12/26/18 0710

## 2018-12-25 NOTE — ED Notes (Signed)
ED Provider at bedside. 

## 2018-12-25 NOTE — ED Triage Notes (Signed)
Pt reports SOB, dx with pneumonia Thursday. Taking antibiotics. Reports continued back pain with deep breathing. Denies fevers. NAD at present.

## 2018-12-25 NOTE — ED Notes (Signed)
Patient transported to X-ray 

## 2018-12-25 NOTE — Discharge Instructions (Addendum)
You were seen in the emergency department today for pain to your shoulder blade area.  Your chest x-ray did not look significantly different from your prior imaging.  Your labs also were similar to prior, you are anemic, this needs primary care recheck.  Please continue the antibiotics as prescribed by urgent care.  We are sending home with the following medicines to also help with your symptoms: - Naproxen is a nonsteroidal anti-inflammatory medication that will help with pain and swelling. Be sure to take this medication as prescribed with food, 1 pill every 12 hours,  It should be taken with food, as it can cause stomach upset, and more seriously, stomach bleeding. Do not take other nonsteroidal anti-inflammatory medications with this such as Advil, Motrin, Aleve, Mobic, Goodie Powder, or Motrin.    - Robaxin is the muscle relaxer I have prescribed, this is meant to help with muscle tightness. Be aware that this medication may make you drowsy therefore the first time you take this it should be at a time you are in an environment where you can rest. Do not drive or operate heavy machinery when taking this medication. Do not drink alcohol or take other sedating medications with this medicine such as narcotics or benzodiazepines.   - Lidoderm patch-apply 1 patch to her left shoulder blade daily as needed for pain  You make take Tylenol per over the counter dosing with these medications.   We have prescribed you new medication(s) today. Discuss the medications prescribed today with your pharmacist as they can have adverse effects and interactions with your other medicines including over the counter and prescribed medications. Seek medical evaluation if you start to experience new or abnormal symptoms after taking one of these medicines, seek care immediately if you start to experience difficulty breathing, feeling of your throat closing, facial swelling, or rash as these could be indications of a more  serious allergic reaction  Please apply heat to the affected area as well.  We have tested you for COVID-19, we will call you if results are positive, if positive you will need to quarantine  Please follow-up with primary care within 3 days.  Return to the ER for new or worsening symptoms including but not limited to worsening pain, trouble breathing, passing out, coughing up blood, fever, or any other concerns.

## 2018-12-26 LAB — NOVEL CORONAVIRUS, NAA (HOSP ORDER, SEND-OUT TO REF LAB; TAT 18-24 HRS): SARS-CoV-2, NAA: NOT DETECTED

## 2019-01-31 ENCOUNTER — Ambulatory Visit (HOSPITAL_COMMUNITY)
Admission: EM | Admit: 2019-01-31 | Discharge: 2019-01-31 | Disposition: A | Discharge status: Home / Self Care | Payer: Medicaid Other | Attending: Family Medicine | Admitting: Family Medicine

## 2019-01-31 ENCOUNTER — Ambulatory Visit (INDEPENDENT_AMBULATORY_CARE_PROVIDER_SITE_OTHER): Payer: Medicaid Other

## 2019-01-31 ENCOUNTER — Encounter (HOSPITAL_COMMUNITY): Payer: Self-pay

## 2019-01-31 ENCOUNTER — Other Ambulatory Visit: Payer: Self-pay

## 2019-01-31 DIAGNOSIS — M546 Pain in thoracic spine: Secondary | ICD-10-CM

## 2019-01-31 MED ORDER — KETOROLAC TROMETHAMINE 60 MG/2ML IM SOLN
60.0000 mg | Freq: Once | INTRAMUSCULAR | Status: AC
  Administered 2019-01-31: 18:00:00 60 mg via INTRAMUSCULAR

## 2019-01-31 MED ORDER — IBUPROFEN 800 MG PO TABS: 800.0000 mg | ORAL_TABLET | Freq: Three times a day (TID) | ORAL | 0 refills | Status: DC

## 2019-01-31 MED ORDER — KETOROLAC TROMETHAMINE 60 MG/2ML IM SOLN
INTRAMUSCULAR | Status: AC
  Filled 2019-01-31: qty 2

## 2019-01-31 MED ORDER — CYCLOBENZAPRINE HCL 5 MG PO TABS: 5.0000 mg | ORAL_TABLET | Freq: Two times a day (BID) | ORAL | 0 refills | Status: DC | PRN

## 2019-01-31 NOTE — Discharge Instructions (Addendum)
Use anti-inflammatories for pain/swelling. You may take up to 800 mg Ibuprofen every 8 hours with food. You may supplement Ibuprofen with Tylenol [id number] mg every 8 hours.   You may use flexeril as needed to help with pain. This is a muscle relaxer and causes sedation- please use only at bedtime or when you will be home and not have to drive/work  Follow up with primary care Follow up here or emergency room if symptom worsening or progressing

## 2019-01-31 NOTE — ED Triage Notes (Signed)
Pt presents to UC w/ c/o back pain on mid left upper back x1 month. Pt states she was seen here 3 weeks ago for same pain but has not gotten better. Pt states it hurts when taking a breath. Pt states she has finished all the meds they prescribed her last time.

## 2019-02-02 NOTE — ED Provider Notes (Signed)
[facility]    CSN: [id number] Arrival date & time: 01/31/19  1513      History   Chief Complaint Chief Complaint  Patient presents with  . Back Pain    HPI Nicole Hurley is a 33 y.o. female history of hypertension, cholelithiasis presenting today for evaluation of back pain.  Patient states that over the past month she has had pain around her left shoulder blade.  Patient states that she was seen here approximately 1 month ago with similar pain and was noted to have pneumonia.  Initial x-ray was read as left lower opacity; recommended follow-up in 1 month to rule out malignancy.  Patient was started on Augmentin and azithromycin.  Patient was seen in the emergency room the following day for same pain with repeat x-ray that noted right lower opacity.  Pain at the time was reproducible on exam and was thought to likely be more MSK cause.  She was given Naprosyn and a muscle relaxer.  States that her pain has persisted and she feels like it is worsened.  States that the pain worsens with taking a deep breath.  She denies any cough.  Denies feeling short of breath.  Denies any chest pain.  Denies any injury or repetitive movements with her left arm.  HPI  Past Medical History:  Diagnosis Date  . Gallstones 09/2017  . Hypertension     Patient Active Problem List   Diagnosis Date Noted  . Cholecystitis 10/21/2017    Past Surgical History:  Procedure Laterality Date  . CESAREAN SECTION    . CHOLECYSTECTOMY N/A 10/22/2017   Procedure: LAPAROSCOPIC CHOLECYSTECTOMY WITH POSSIBLE INTRAOPERATIVE CHOLANGIOGRAM;  Surgeon: Harriette Bouillonornett, Thomas, MD;  Location: [facility] OR;  Service: General;  Laterality: N/A;  . DENTAL SURGERY      OB History   No obstetric history on file.      Home Medications    Prior to Admission medications   Medication Sig Start Date End Date Taking? Authorizing Provider  acetaminophen (TYLENOL) 500 MG tablet Take 2 tablets (1,000 mg total) by mouth every  8 (eight) hours as needed for mild pain. 10/23/17   Focht, Joyce CopaJessica L, PA  amLODipine (NORVASC) 5 MG tablet Take 5 mg by mouth daily.     [provider]  azithromycin (ZITHROMAX) 250 MG tablet Take 1 tablet (250 mg total) by mouth daily. 2 tabs po on day 1, 1 tab po on days 2-5 12/23/18   Domenick GongMortenson, Ashley, MD  benzonatate (TESSALON) 100 MG capsule Take 1 capsule (100 mg total) by mouth 2 (two) times daily as needed for cough. 02/22/18   Riki SheerYoung, Michelle G, PA-C  calcium carbonate (TUMS) 500 MG chewable tablet Chew 500 mg by mouth daily as needed for indigestion or heartburn.     [provider]  cyclobenzaprine (FLEXERIL) 5 MG tablet Take 1-2 tablets (5-10 mg total) by mouth 2 (two) times daily as needed for muscle spasms. 01/31/19   Jovannie Ulibarri C, PA-C  diclofenac sodium (VOLTAREN) 1 % GEL Apply 4 g topically 4 (four) times daily. Patient taking differently: Apply 4 g topically 4 (four) times daily as needed (pain/rash).  03/29/17   Gwyneth SproutPlunkett, Whitney, MD  dicyclomine (BENTYL) 20 MG tablet Take 1 tablet (20 mg total) by mouth every 12 (twelve) hours as needed for spasms. Use for abdominal pain/cramping, as prescribed. 10/19/17   Antony MaduraHumes, Kelly, PA-C  ibuprofen (ADVIL) 800 MG tablet Take 1 tablet (800 mg total) by mouth 3 (three) times  daily. 01/31/19   Bela Nyborg C, PA-C  lidocaine (LIDODERM) 5 % Place 1 patch onto the skin daily. Apply to left scapula once per day as needed. Remove & Discard patch within 12 hours 12/25/18   Petrucelli, Samantha R, PA-C  metFORMIN (GLUCOPHAGE) 1000 MG tablet Take 1,000 mg by mouth 2 (two) times daily with a meal.    [provider]  methocarbamol (ROBAXIN) 500 MG tablet Take 1 tablet (500 mg total) by mouth every 8 (eight) hours as needed for muscle spasms. 12/25/18   Petrucelli, Samantha R, PA-C  oxyCODONE (OXY IR/ROXICODONE) 5 MG immediate release tablet Take 1 tablet (5 mg total) by mouth every 6 (six) hours as needed for moderate pain. 10/23/17    Focht, Fraser Din, PA  Simethicone (GAS RELIEF PO) Take 1 tablet by mouth daily as needed (gas).    [provider]  tiZANidine (ZANAFLEX) 4 MG tablet Take 1 tablet (4 mg total) by mouth every 8 (eight) hours as needed for muscle spasms. 12/23/18   Melynda Ripple, MD    Family History Family History  Problem Relation Age of Onset  . Healthy Mother   . Healthy Father     Social History Social History   Tobacco Use  . Smoking status: Former Smoker    Types: Cigarettes  . Smokeless tobacco: Never Used  Substance Use Topics  . Alcohol use: Yes  . Drug use: No     Allergies   Benadryl [diphenhydramine hcl (sleep)]   Review of Systems Review of Systems  Constitutional: Negative for activity change, appetite change, chills, fatigue and fever.  HENT: Negative for congestion, ear pain, rhinorrhea, sinus pressure, sore throat and trouble swallowing.   Eyes: Negative for discharge and redness.  Respiratory: Negative for cough, chest tightness and shortness of breath.   Cardiovascular: Negative for chest pain.  Gastrointestinal: Negative for abdominal pain, diarrhea, nausea and vomiting.  Musculoskeletal: Positive for back pain. Negative for myalgias.  Skin: Negative for rash.  Neurological: Negative for dizziness, light-headedness and headaches.     Physical Exam Triage Vital Signs ED Triage Vitals  Enc Vitals Group     BP 01/31/19 1614 (!) 150/94     Pulse Rate 01/31/19 1614 81     Resp 01/31/19 1614 18     Temp 01/31/19 1614 98 F (36.7 C)     Temp Source 01/31/19 1614 Oral     SpO2 01/31/19 1614 100 %     Weight --      Height --      Head Circumference --      Peak Flow --      Pain Score 01/31/19 1618 9     Pain Loc --      Pain Edu? --      Excl. in [place]? --    No data found.  Updated Vital Signs BP (!) 150/94 (BP Location: Left Wrist)   Pulse 81   Temp 98 F (36.7 C) (Oral)   Resp 18   LMP 01/29/2019   SpO2 100%   Visual Acuity Right Eye  Distance:   Left Eye Distance:   Bilateral Distance:    Right Eye Near:   Left Eye Near:    Bilateral Near:     Physical Exam Vitals signs and nursing note reviewed.  Constitutional:      General: She is not in acute distress.    Appearance: She is well-developed.  HENT:     Head: Normocephalic and atraumatic.  Eyes:     Conjunctiva/sclera: Conjunctivae normal.  Neck:     Musculoskeletal: Neck supple.  Cardiovascular:     Rate and Rhythm: Normal rate and regular rhythm.     Heart sounds: No murmur.  Pulmonary:     Effort: Pulmonary effort is normal. No respiratory distress.     Breath sounds: Normal breath sounds.     Comments: Breathing comfortably at rest, CTABL, no wheezing, rales or other adventitious sounds auscultated  Abdominal:     Palpations: Abdomen is soft.     Tenderness: There is no abdominal tenderness.  Musculoskeletal:     Comments: Nontender to palpation of left thoracic musculature, nontender to spine midline Full active range of motion of left shoulder, nontender along clavicle, AC joint.  Skin:    General: Skin is warm and dry.  Neurological:     Mental Status: She is alert.      UC Treatments / Results  Labs (all labs ordered are listed, but only abnormal results are displayed) Labs Reviewed - No data to display  EKG   Radiology Dg Chest 2 View  Result Date: 01/31/2019 CLINICAL DATA:  LEFT scapular pain for 1 month when taking a deep breath, borderline diabetes mellitus, former smoker, pneumonia 1 month ago EXAM: CHEST - 2 VIEW COMPARISON:  12/25/2018 FINDINGS: Enlargement of cardiac silhouette. Mediastinal contours and pulmonary vascularity normal. Increased opacity at medial RIGHT lung base question atelectasis versus infiltrate. Remaining lungs clear. No pleural effusion or pneumothorax. IMPRESSION: Enlargement of cardiac silhouette. Persistent medial RIGHT basilar opacity question atelectasis versus infiltrate. Electronically Signed   By:  Ulyses Southward M.D.   On: 01/31/2019 17:34    Procedures Procedures (including critical care time)  Medications Ordered in UC Medications  ketorolac (TORADOL) injection 60 mg (60 mg Intramuscular Given 01/31/19 1811)  ketorolac (TORADOL) 60 MG/2ML injection (has no administration in time range)    Initial Impression / Assessment and Plan / UC Course  I have reviewed the triage vital signs and the nursing notes.  Pertinent labs & imaging results that were available during my care of the patient were reviewed by me and considered in my medical decision making (see chart for details).     Repeated x-ray today given recommendation of initial x-ray and now completion of antibiotics.  X-ray today still showing right basilar opacity versus atelectasis.  Given without cough, completion of appropriate course of antibiotics and symptoms on right side when patient's pain is felt on the left feel this is more likely atelectasis.  Will have follow-up with primary care for further evaluation of this, may need possible CT.  Unclear etiology, has history of cholelithiasis, but would expect right shoulder pain instead of left.  We will continue to treat with anti-inflammatories in the interim and provide Toradol prior to discharge.  Patient is stable.Discussed strict return precautions. Patient verbalized understanding and is agreeable with plan.  Final Clinical Impressions(s) / UC Diagnoses   Final diagnoses:  Acute left-sided thoracic back pain     Discharge Instructions     Use anti-inflammatories for pain/swelling. You may take up to 800 mg Ibuprofen every 8 hours with food. You may supplement Ibuprofen with Tylenol [phone] mg every 8 hours.   You may use flexeril as needed to help with pain. This is a muscle relaxer and causes sedation- please use only at bedtime or when you will be home and not have to drive/work  Follow up with primary care Follow up here or emergency  room if symptom worsening  or progressing   ED Prescriptions    Medication Sig Dispense Auth. Provider   cyclobenzaprine (FLEXERIL) 5 MG tablet Take 1-2 tablets (5-10 mg total) by mouth 2 (two) times daily as needed for muscle spasms. 24 tablet Ramal Eckhardt C, PA-C   ibuprofen (ADVIL) 800 MG tablet Take 1 tablet (800 mg total) by mouth 3 (three) times daily. 21 tablet Rodnisha Blomgren, [place] C, PA-C     PDMP not reviewed this encounter.   Lew Dawes, PA-C 02/02/19 1607

## 2019-03-14 ENCOUNTER — Other Ambulatory Visit: Payer: Self-pay

## 2019-03-14 ENCOUNTER — Encounter (HOSPITAL_COMMUNITY): Payer: Self-pay | Admitting: Emergency Medicine

## 2019-03-14 ENCOUNTER — Ambulatory Visit (HOSPITAL_COMMUNITY)
Admission: EM | Admit: 2019-03-14 | Discharge: 2019-03-14 | Disposition: A | Discharge status: Home / Self Care | Payer: Medicaid Other | Attending: Family Medicine | Admitting: Family Medicine

## 2019-03-14 DIAGNOSIS — R197 Diarrhea, unspecified: Secondary | ICD-10-CM | POA: Diagnosis not present

## 2019-03-14 NOTE — ED Provider Notes (Signed)
[facility]    CSN: [id number] Arrival date & time: 03/14/19  1609      History   Chief Complaint Chief Complaint  Patient presents with  . Diarrhea  . Abdominal Cramping    HPI Nicole Hurley is a 33 y.o. female.   HPI  Patient is here having eaten at a [employer].  Within 30 minutes she developed diarrhea and cramping.  She states this happened last time she ate at the restaurant also.  She does not know if it is food poisoning or food intolerance.  She missed work today.  She is a bit better this afternoon than she was this morning.  She works in [employer].  She would like a note to be off another day.  She is drinking fluids normally.  No nausea or vomiting.  No fever or chills.  No coughing or chest congestion.  No known exposure to coronavirus.  Past Medical History:  Diagnosis Date  . Gallstones 09/2017  . Hypertension     Patient Active Problem List   Diagnosis Date Noted  . Cholecystitis 10/21/2017    Past Surgical History:  Procedure Laterality Date  . CESAREAN SECTION    . CHOLECYSTECTOMY N/A 10/22/2017   Procedure: LAPAROSCOPIC CHOLECYSTECTOMY WITH POSSIBLE INTRAOPERATIVE CHOLANGIOGRAM;  Surgeon: Harriette Bouillon, MD;  Location: [facility];  Service: General;  Laterality: N/A;  . DENTAL SURGERY      OB History   No obstetric history on file.      Home Medications    Prior to Admission medications   Medication Sig Start Date End Date Taking? Authorizing Provider  acetaminophen (TYLENOL) 500 MG tablet Take 2 tablets (1,000 mg total) by mouth every 8 (eight) hours as needed for mild pain. 10/23/17   Focht, Joyce Copa, PA  amLODipine (NORVASC) 5 MG tablet Take 5 mg by mouth daily.     [provider]  calcium carbonate (TUMS) 500 MG chewable tablet Chew 500 mg by mouth daily as needed for indigestion or heartburn.     [provider]  diclofenac sodium (VOLTAREN) 1 % GEL Apply 4 g topically 4 (four) times daily. Patient  taking differently: Apply 4 g topically 4 (four) times daily as needed (pain/rash).  03/29/17   Gwyneth Sprout, MD  dicyclomine (BENTYL) 20 MG tablet Take 1 tablet (20 mg total) by mouth every 12 (twelve) hours as needed for spasms. Use for abdominal pain/cramping, as prescribed. 10/19/17   Antony Madura, PA-C  ibuprofen (ADVIL) 800 MG tablet Take 1 tablet (800 mg total) by mouth 3 (three) times daily. 01/31/19   Wieters, Hallie C, PA-C  lidocaine (LIDODERM) 5 % Place 1 patch onto the skin daily. Apply to left scapula once per day as needed. Remove & Discard patch within 12 hours 12/25/18   Petrucelli, Samantha R, PA-C  metFORMIN (GLUCOPHAGE) 1000 MG tablet Take 1,000 mg by mouth 2 (two) times daily with a meal.    [provider]  methocarbamol (ROBAXIN) 500 MG tablet Take 1 tablet (500 mg total) by mouth every 8 (eight) hours as needed for muscle spasms. 12/25/18   Petrucelli, Samantha R, PA-C  oxyCODONE (OXY IR/ROXICODONE) 5 MG immediate release tablet Take 1 tablet (5 mg total) by mouth every 6 (six) hours as needed for moderate pain. 10/23/17   Focht, Joyce Copa, PA  Simethicone (GAS RELIEF PO) Take 1 tablet by mouth daily as needed (gas).    [provider]  tiZANidine (ZANAFLEX) 4 MG tablet  Take 1 tablet (4 mg total) by mouth every 8 (eight) hours as needed for muscle spasms. 12/23/18   Domenick GongMortenson, Ashley, MD    Family History Family History  Problem Relation Age of Onset  . Healthy Mother   . Healthy Father     Social History Social History   Tobacco Use  . Smoking status: Former Smoker    Types: Cigarettes  . Smokeless tobacco: Never Used  Substance Use Topics  . Alcohol use: Yes  . Drug use: No     Allergies   Benadryl [diphenhydramine hcl (sleep)]   Review of Systems Review of Systems  Constitutional: Negative for chills and fever.  HENT: Negative for congestion and hearing loss.   Eyes: Negative for pain.  Respiratory: Negative for cough and shortness of  breath.   Cardiovascular: Negative for chest pain and leg swelling.  Gastrointestinal: Positive for diarrhea. Negative for abdominal pain and constipation.  Genitourinary: Negative for dysuria and frequency.  Musculoskeletal: Negative for myalgias.  Neurological: Negative for dizziness, seizures and headaches.  Psychiatric/Behavioral: The patient is not nervous/anxious.      Physical Exam Triage Vital Signs ED Triage Vitals  Enc Vitals Group     BP 03/14/19 1757 (!) 120/102     Pulse Rate 03/14/19 1757 80     Resp 03/14/19 1757 (!) 24     Temp 03/14/19 1757 98.3 F (36.8 C)     Temp Source 03/14/19 1757 Oral     SpO2 --      Weight --      Height --      Head Circumference --      Peak Flow --      Pain Score 03/14/19 1755 4     Pain Loc --      Pain Edu? --      Excl. in [place]? --    No data found.  Updated Vital Signs BP 138/72 (BP Location: Left Wrist) Comment (BP Location): reg cuff  Pulse 80   Temp 98.3 F (36.8 C) (Oral)   Resp (!) 24   LMP 03/07/2019 (Exact Date)   Physical Exam Constitutional:      General: She is not in acute distress.    Appearance: She is well-developed. She is obese.     Comments: Morbidly obese.  No acute distress  HENT:     Head: Normocephalic and atraumatic.  Eyes:     Conjunctiva/sclera: Conjunctivae normal.     Pupils: Pupils are equal, round, and reactive to light.  Cardiovascular:     Rate and Rhythm: Normal rate.  Pulmonary:     Effort: Pulmonary effort is normal. No respiratory distress.  Abdominal:     General: There is no distension.     Palpations: Abdomen is soft.     Comments: Protuberant abdomen.  Active bowel sounds.  No tenderness  Musculoskeletal:        General: Normal range of motion.     Cervical back: Normal range of motion.  Skin:    General: Skin is warm and dry.  Neurological:     Mental Status: She is alert.  Psychiatric:        Mood and Affect: Mood normal.        Behavior: Behavior normal.       UC Treatments / Results  Labs (all labs ordered are listed, but only abnormal results are displayed) Labs Reviewed - No data to display  EKG   Radiology No results found.  Procedures Procedures (including critical care time)  Medications Ordered in UC Medications - No data to display  Initial Impression / Assessment and Plan / UC Course  I have reviewed the triage vital signs and the nursing notes.  Pertinent labs & imaging results that were available during my care of the patient were reviewed by me and considered in my medical decision making (see chart for details).     Symptoms that are within 30 minutes of eating or not likely from food poisoning.  I explained that this is a bacterial infection that takes longer to present.  I think it is more likely that she is sensitive to one of the components and has a food allergy of some sort.  I recommended she not eat at this facility again Final Clinical Impressions(s) / UC Diagnoses   Final diagnoses:  Diarrhea, unspecified type     Discharge Instructions     Push fluids Take kao pectate or imodium if needed Call for problems   ED Prescriptions    None     PDMP not reviewed this encounter.   Raylene Everts, MD 03/14/19 2013

## 2019-03-14 NOTE — ED Triage Notes (Signed)
Pt reports having abdominal cramping and diarrhea since she ate chinese food yesterday.  She states it is better today than yesterday, but she is still having diarrhea.  Pt states she has eaten at this same restaurant previously and got food poisoning around 6 months ago.

## 2019-03-14 NOTE — Discharge Instructions (Signed)
Push fluids Take kao pectate or imodium if needed Call for problems

## 2020-04-03 ENCOUNTER — Encounter (HOSPITAL_COMMUNITY): Payer: Self-pay

## 2020-04-03 ENCOUNTER — Ambulatory Visit (HOSPITAL_COMMUNITY)
Admission: EM | Admit: 2020-04-03 | Discharge: 2020-04-03 | Disposition: A | Discharge status: Home / Self Care | Payer: Medicaid Other | Attending: Family Medicine | Admitting: Family Medicine

## 2020-04-03 ENCOUNTER — Other Ambulatory Visit: Payer: Self-pay

## 2020-04-03 DIAGNOSIS — Z20822 Contact with and (suspected) exposure to covid-19: Secondary | ICD-10-CM

## 2020-04-03 DIAGNOSIS — J069 Acute upper respiratory infection, unspecified: Secondary | ICD-10-CM | POA: Insufficient documentation

## 2020-04-03 DIAGNOSIS — U071 COVID-19: Secondary | ICD-10-CM | POA: Diagnosis not present

## 2020-04-03 MED ORDER — ALBUTEROL SULFATE HFA 108 (90 BASE) MCG/ACT IN AERS: 1.0000 | INHALATION_SPRAY | Freq: Four times a day (QID) | RESPIRATORY_TRACT | 0 refills | Status: DC | PRN

## 2020-04-03 MED ORDER — PROMETHAZINE-DM 6.25-15 MG/5ML PO SYRP: 5.0000 mL | ORAL_SOLUTION | Freq: Four times a day (QID) | ORAL | 0 refills | Status: DC | PRN

## 2020-04-03 NOTE — ED Triage Notes (Signed)
Pt c/o non-productive cough, sore throat, chills, malaise, and fever since yesterday, Tmax 103.0 yesterday. Also reports SOB, pt states baseline is SOBOE 2/2 weight.  Has been taking theraflu, nyquil. Last took 400mg  ibuprofen at approx 1400 today. States her daughter tested positive for COVID 2 days ago. Denies n/v/d.  Diminished lung sounds right side.

## 2020-04-04 LAB — SARS CORONAVIRUS 2 (TAT 6-24 HRS): SARS Coronavirus 2: POSITIVE — AB

## 2020-04-05 ENCOUNTER — Encounter: Payer: Self-pay | Admitting: Adult Health

## 2020-04-05 ENCOUNTER — Telehealth: Payer: Self-pay | Admitting: Adult Health

## 2020-04-05 NOTE — Telephone Encounter (Signed)
Called to discuss with patient about COVID-19 symptoms and the use of one of the available treatments for those with mild to moderate Covid symptoms and at a high risk of hospitalization.  Pt appears to qualify for outpatient treatment due to co-morbid conditions and/or a member of an at-risk group in accordance with the FDA Emergency Use Authorization.    Symptom onset: 3 days ago Vaccinated: no Booster? no Immunocompromised? no Qualifiers: BMI  Unable to reach pt - LMOM, my chart message sent   Noreene Filbert

## 2020-04-07 NOTE — ED Provider Notes (Signed)
[facility]    CSN: [id number] Arrival date & time: 04/03/20  1855      History   Chief Complaint Chief Complaint  Patient presents with  . Cough  . Headache    HPI Nicole Hurley is a 35 y.o. female.   Here today with dry cough, sore throat, chills, fatigue, and fever with tmax of 103 since yesterday. Denies CP, severe SOB, abdominal pain, N/V/D. Has some mild SOB but she states this is her baseline secondary to obesity. Taking theraflu, nyquil, and OTC pain relievers with minimal relief. Children are both COVID+ at this time.      Past Medical History:  Diagnosis Date  . Gallstones 09/2017  . Hypertension     Patient Active Problem List   Diagnosis Date Noted  . Cholecystitis 10/21/2017    Past Surgical History:  Procedure Laterality Date  . CESAREAN SECTION    . CHOLECYSTECTOMY N/A 10/22/2017   Procedure: LAPAROSCOPIC CHOLECYSTECTOMY WITH POSSIBLE INTRAOPERATIVE CHOLANGIOGRAM;  Surgeon: Harriette Bouillon, MD;  Location: [facility];  Service: General;  Laterality: N/A;  . DENTAL SURGERY      OB History   No obstetric history on file.      Home Medications    Prior to Admission medications   Medication Sig Start Date End Date Taking? Authorizing Provider  albuterol (VENTOLIN HFA) 108 (90 Base) MCG/ACT inhaler Inhale 1-2 puffs into the lungs every 6 (six) hours as needed for wheezing or shortness of breath. 04/03/20  Yes Particia Nearing, PA-C  promethazine-dextromethorphan (PROMETHAZINE-DM) 6.25-15 MG/5ML syrup Take 5 mLs by mouth 4 (four) times daily as needed for cough. 04/03/20  Yes Particia Nearing, PA-C  acetaminophen (TYLENOL) 500 MG tablet Take 2 tablets (1,000 mg total) by mouth every 8 (eight) hours as needed for mild pain. 10/23/17   Focht, Joyce Copa, PA  amLODipine (NORVASC) 5 MG tablet Take 5 mg by mouth daily.     [provider]  calcium carbonate (TUMS) 500 MG chewable tablet Chew 500 mg by mouth daily as needed for  indigestion or heartburn.     [provider]  diclofenac sodium (VOLTAREN) 1 % GEL Apply 4 g topically 4 (four) times daily. Patient taking differently: Apply 4 g topically 4 (four) times daily as needed (pain/rash).  03/29/17   Gwyneth Sprout, MD  dicyclomine (BENTYL) 20 MG tablet Take 1 tablet (20 mg total) by mouth every 12 (twelve) hours as needed for spasms. Use for abdominal pain/cramping, as prescribed. 10/19/17   Antony Madura, PA-C  ibuprofen (ADVIL) 800 MG tablet Take 1 tablet (800 mg total) by mouth 3 (three) times daily. 01/31/19   Wieters, Hallie C, PA-C  lidocaine (LIDODERM) 5 % Place 1 patch onto the skin daily. Apply to left scapula once per day as needed. Remove & Discard patch within 12 hours 12/25/18   Petrucelli, Samantha R, PA-C  metFORMIN (GLUCOPHAGE) 1000 MG tablet Take 1,000 mg by mouth 2 (two) times daily with a meal.    [provider]  methocarbamol (ROBAXIN) 500 MG tablet Take 1 tablet (500 mg total) by mouth every 8 (eight) hours as needed for muscle spasms. 12/25/18   Petrucelli, Samantha R, PA-C  oxyCODONE (OXY IR/ROXICODONE) 5 MG immediate release tablet Take 1 tablet (5 mg total) by mouth every 6 (six) hours as needed for moderate pain. 10/23/17   Focht, Joyce Copa, PA  Simethicone (GAS RELIEF PO) Take 1 tablet by mouth daily as needed (gas).  [provider]  tiZANidine (ZANAFLEX) 4 MG tablet Take 1 tablet (4 mg total) by mouth every 8 (eight) hours as needed for muscle spasms. 12/23/18   Domenick Gong, MD    Family History Family History  Problem Relation Age of Onset  . Healthy Mother   . Healthy Father     Social History Social History   Tobacco Use  . Smoking status: Former Smoker    Types: Cigarettes  . Smokeless tobacco: Never Used  Vaping Use  . Vaping Use: Never used  Substance Use Topics  . Alcohol use: Yes  . Drug use: No     Allergies   Benadryl [diphenhydramine hcl (sleep)]   Review of Systems Review of  Systems PER HPI    Physical Exam Triage Vital Signs ED Triage Vitals  Enc Vitals Group     BP 04/03/20 1944 (!) 161/93     Pulse Rate 04/03/20 1944 88     Resp 04/03/20 1944 20     Temp 04/03/20 1944 98.1 F (36.7 C)     Temp Source 04/03/20 1944 Oral     SpO2 04/03/20 1940 100 %     Weight 04/03/20 1940 (!) 380 lb (172.4 kg)     Height 04/03/20 1940 5\' 6"  (1.676 m)     Head Circumference --      Peak Flow --      Pain Score 04/03/20 1939 10     Pain Loc --      Pain Edu? --      Excl. in GC? --    No data found.  Updated Vital Signs BP (!) 161/93 (BP Location: Right Wrist)   Pulse 88   Temp 98.1 F (36.7 C) (Oral)   Resp 20   Ht 5\' 6"  (1.676 m)   Wt (!) 380 lb (172.4 kg)   LMP 03/26/2020   SpO2 100%   BMI 61.33 kg/m   Visual Acuity Right Eye Distance:   Left Eye Distance:   Bilateral Distance:    Right Eye Near:   Left Eye Near:    Bilateral Near:     Physical Exam Vitals and nursing note reviewed.  Constitutional:      Appearance: Normal appearance. She is obese. She is not ill-appearing.  HENT:     Head: Atraumatic.     Right Ear: Tympanic membrane normal.     Left Ear: Tympanic membrane normal.     Nose: Rhinorrhea present.     Mouth/Throat:     Mouth: Mucous membranes are moist.     Pharynx: Posterior oropharyngeal erythema present.  Eyes:     Extraocular Movements: Extraocular movements intact.     Conjunctiva/sclera: Conjunctivae normal.  Cardiovascular:     Rate and Rhythm: Normal rate and regular rhythm.     Heart sounds: Normal heart sounds.  Pulmonary:     Effort: Pulmonary effort is normal. No respiratory distress.     Breath sounds: Normal breath sounds. No wheezing or rales.  Abdominal:     General: Bowel sounds are normal. There is no distension.     Palpations: Abdomen is soft.     Tenderness: There is no abdominal tenderness. There is no guarding.  Musculoskeletal:        General: Normal range of motion.     Cervical back:  Normal range of motion and neck supple.  Skin:    General: Skin is warm and dry.  Neurological:     Mental Status: She  is alert and oriented to person, place, and time.  Psychiatric:        Mood and Affect: Mood normal.        Thought Content: Thought content normal.        Judgment: Judgment normal.      UC Treatments / Results  Labs (all labs ordered are listed, but only abnormal results are displayed) Labs Reviewed  SARS CORONAVIRUS 2 (TAT 6-24 HRS) - Abnormal; Notable for the following components:      Result Value   SARS Coronavirus 2 POSITIVE (*)    All other components within normal limits    EKG   Radiology No results found.  Procedures Procedures (including critical care time)  Medications Ordered in UC Medications - No data to display  Initial Impression / Assessment and Plan / UC Course  I have reviewed the triage vital signs and the nursing notes.  Pertinent labs & imaging results that were available during my care of the patient were reviewed by me and considered in my medical decision making (see chart for details).     Highly suspicious for COVID given sxs and exposures, COVID pcr pending, vitals and exam reassuring today. Phenergan DM and albuterol inhaler given for cough, supportive care and OTC remedies reviewed. Return for acutely worsening sxs. WOrk note given   Final Clinical Impressions(s) / UC Diagnoses   Final diagnoses:  Viral URI with cough  Exposure to COVID-19 virus   Discharge Instructions   None    ED Prescriptions    Medication Sig Dispense Auth. Provider   promethazine-dextromethorphan (PROMETHAZINE-DM) 6.25-15 MG/5ML syrup Take 5 mLs by mouth 4 (four) times daily as needed for cough. 100 mL Particia Nearing, PA-C   albuterol (VENTOLIN HFA) 108 (90 Base) MCG/ACT inhaler Inhale 1-2 puffs into the lungs every 6 (six) hours as needed for wheezing or shortness of breath. 18 g Particia Nearing, [place]     PDMP not  reviewed this encounter.   Particia Nearing, [place] 04/07/20 1136

## 2020-06-05 ENCOUNTER — Ambulatory Visit (HOSPITAL_COMMUNITY)
Admission: EM | Admit: 2020-06-05 | Discharge: 2020-06-05 | Disposition: A | Discharge status: Home / Self Care | Payer: Medicaid Other | Attending: Student | Admitting: Student

## 2020-06-05 ENCOUNTER — Encounter (HOSPITAL_COMMUNITY): Payer: Self-pay

## 2020-06-05 ENCOUNTER — Other Ambulatory Visit: Payer: Self-pay

## 2020-06-05 ENCOUNTER — Emergency Department (HOSPITAL_COMMUNITY)
Admission: EM | Admit: 2020-06-05 | Discharge: 2020-06-06 | Disposition: A | Discharge status: Home / Self Care | Payer: Medicaid Other | Attending: Emergency Medicine | Admitting: Emergency Medicine

## 2020-06-05 DIAGNOSIS — I10 Essential (primary) hypertension: Secondary | ICD-10-CM | POA: Diagnosis not present

## 2020-06-05 DIAGNOSIS — Z87891 Personal history of nicotine dependence: Secondary | ICD-10-CM | POA: Insufficient documentation

## 2020-06-05 DIAGNOSIS — Z79899 Other long term (current) drug therapy: Secondary | ICD-10-CM | POA: Diagnosis not present

## 2020-06-05 DIAGNOSIS — R1310 Dysphagia, unspecified: Secondary | ICD-10-CM | POA: Diagnosis not present

## 2020-06-05 DIAGNOSIS — R198 Other specified symptoms and signs involving the digestive system and abdomen: Secondary | ICD-10-CM

## 2020-06-05 DIAGNOSIS — R0989 Other specified symptoms and signs involving the circulatory and respiratory systems: Secondary | ICD-10-CM

## 2020-06-05 DIAGNOSIS — R1314 Dysphagia, pharyngoesophageal phase: Secondary | ICD-10-CM

## 2020-06-05 MED ORDER — SUCRALFATE 1 GM/10ML PO SUSP: 1.0000 g | Freq: Three times a day (TID) | ORAL | 0 refills | Status: DC

## 2020-06-05 NOTE — ED Triage Notes (Signed)
Pt c/o a piece of meat stuck in her throat x this morning. Pt states she feels uncomfortable. Pt states she feels is sideways inside of her throat. Pt states she tried vomiting and drinking Coca Cola for the piece of meat to go down or come out.

## 2020-06-05 NOTE — ED Provider Notes (Signed)
[facility]    CSN: [id number] Arrival date & time: 06/05/20  1624      History   Chief Complaint Chief Complaint  Patient presents with  . Sore Throat    HPI Nicole Hurley is a 35 y.o. female presenting with sensation of meat stuck in her throat. History hypertension, cholecystitis. Pt c/o a piece of meat stuck in her throat since this morning. Pt states she feels uncomfortable. Pt states she feels is sideways inside of her throat. Pt states she tried vomiting and drinking Coca Cola for the piece of meat to go down or come out but it has not done so.  Denies shortness of breath, difficulty swallowing, chest pain.  Has not taken her antihypertensives yet today.  Denies chest pain, shortness of breath, vision changes, worst headache of life, weakness or sensation changes in 1 arm or 1 leg  States she could not be pregnant.  HPI  Past Medical History:  Diagnosis Date  . Gallstones 09/2017  . Hypertension     Patient Active Problem List   Diagnosis Date Noted  . Cholecystitis 10/21/2017    Past Surgical History:  Procedure Laterality Date  . CESAREAN SECTION    . CHOLECYSTECTOMY N/A 10/22/2017   Procedure: LAPAROSCOPIC CHOLECYSTECTOMY WITH POSSIBLE INTRAOPERATIVE CHOLANGIOGRAM;  Surgeon: Harriette Bouillon, MD;  Location: [facility];  Service: General;  Laterality: N/A;  . DENTAL SURGERY      OB History   No obstetric history on file.      Home Medications    Prior to Admission medications   Medication Sig Start Date End Date Taking? Authorizing Provider  sucralfate (CARAFATE) 1 GM/10ML suspension Take 10 mLs (1 g total) by mouth in the morning, at noon, and at bedtime for 9 doses. Up to 3x daily (every 8 hours) for sensation of food stuck in throat 06/05/20 06/08/20 Yes Rhys Martini, PA-C  acetaminophen (TYLENOL) 500 MG tablet Take 2 tablets (1,000 mg total) by mouth every 8 (eight) hours as needed for mild pain. 10/23/17   Focht, Joyce Copa, PA  albuterol  (VENTOLIN HFA) 108 (90 Base) MCG/ACT inhaler Inhale 1-2 puffs into the lungs every 6 (six) hours as needed for wheezing or shortness of breath. 04/03/20   Particia Nearing, PA-C  amLODipine (NORVASC) 5 MG tablet Take 5 mg by mouth daily.     [provider]  calcium carbonate (TUMS) 500 MG chewable tablet Chew 500 mg by mouth daily as needed for indigestion or heartburn.     [provider]  diclofenac sodium (VOLTAREN) 1 % GEL Apply 4 g topically 4 (four) times daily. Patient taking differently: Apply 4 g topically 4 (four) times daily as needed (pain/rash).  03/29/17   Gwyneth Sprout, MD  dicyclomine (BENTYL) 20 MG tablet Take 1 tablet (20 mg total) by mouth every 12 (twelve) hours as needed for spasms. Use for abdominal pain/cramping, as prescribed. 10/19/17   Antony Madura, PA-C  ibuprofen (ADVIL) 800 MG tablet Take 1 tablet (800 mg total) by mouth 3 (three) times daily. 01/31/19   Wieters, Hallie C, PA-C  lidocaine (LIDODERM) 5 % Place 1 patch onto the skin daily. Apply to left scapula once per day as needed. Remove & Discard patch within 12 hours 12/25/18   Petrucelli, Samantha R, PA-C  metFORMIN (GLUCOPHAGE) 1000 MG tablet Take 1,000 mg by mouth 2 (two) times daily with a meal.    [provider]  methocarbamol (ROBAXIN) 500 MG tablet Take 1  tablet (500 mg total) by mouth every 8 (eight) hours as needed for muscle spasms. 12/25/18   Petrucelli, Samantha R, PA-C  oxyCODONE (OXY IR/ROXICODONE) 5 MG immediate release tablet Take 1 tablet (5 mg total) by mouth every 6 (six) hours as needed for moderate pain. 10/23/17   Focht, Joyce Copa, PA  promethazine-dextromethorphan (PROMETHAZINE-DM) 6.25-15 MG/5ML syrup Take 5 mLs by mouth 4 (four) times daily as needed for cough. 04/03/20   Particia Nearing, PA-C  Simethicone (GAS RELIEF PO) Take 1 tablet by mouth daily as needed (gas).    [provider]  tiZANidine (ZANAFLEX) 4 MG tablet Take 1 tablet (4 mg total) by  mouth every 8 (eight) hours as needed for muscle spasms. 12/23/18   Domenick Gong, MD    Family History Family History  Problem Relation Age of Onset  . Healthy Mother   . Healthy Father     Social History Social History   Tobacco Use  . Smoking status: Former Smoker    Types: Cigarettes  . Smokeless tobacco: Never Used  Vaping Use  . Vaping Use: Never used  Substance Use Topics  . Alcohol use: Yes  . Drug use: No     Allergies   Benadryl [diphenhydramine hcl (sleep)]   Review of Systems Review of Systems  HENT: Negative for trouble swallowing.        Globus sensation  Respiratory: Negative for chest tightness and shortness of breath.   Cardiovascular: Negative for chest pain.  All other systems reviewed and are negative.    Physical Exam Triage Vital Signs ED Triage Vitals  Enc Vitals Group     BP 06/05/20 1719 (!) 162/91     Pulse Rate 06/05/20 1719 92     Resp 06/05/20 1719 16     Temp 06/05/20 1719 98.4 F (36.9 C)     Temp Source 06/05/20 1719 Oral     SpO2 06/05/20 1719 100 %     Weight --      Height --      Head Circumference --      Peak Flow --      Pain Score 06/05/20 1716 10     Pain Loc --      Pain Edu? --      Excl. in GC? --    No data found.  Updated Vital Signs BP (!) 162/91 (BP Location: Right Arm)   Pulse 92   Temp 98.4 F (36.9 C) (Oral)   Resp 16   LMP 05/16/2020   SpO2 100%   Visual Acuity Right Eye Distance:   Left Eye Distance:   Bilateral Distance:    Right Eye Near:   Left Eye Near:    Bilateral Near:     Physical Exam Vitals reviewed.  Constitutional:      Appearance: She is obese.  HENT:     Head: Normocephalic and atraumatic.  Cardiovascular:     Rate and Rhythm: Normal rate and regular rhythm.     Heart sounds: Normal heart sounds.  Pulmonary:     Effort: Pulmonary effort is normal. No tachypnea, bradypnea, accessory muscle usage, respiratory distress or retractions.     Breath sounds: Normal  breath sounds. No stridor or decreased air movement. No decreased breath sounds, wheezing or rhonchi.  Neurological:     General: No focal deficit present.     Mental Status: She is alert and oriented to person, place, and time.  Psychiatric:  Mood and Affect: Mood is anxious.        Behavior: Behavior normal.      UC Treatments / Results  Labs (all labs ordered are listed, but only abnormal results are displayed) Labs Reviewed - No data to display  EKG   Radiology No results found.  Procedures Procedures (including critical care time)  Medications Ordered in UC Medications - No data to display  Initial Impression / Assessment and Plan / UC Course  I have reviewed the triage vital signs and the nursing notes.  Pertinent labs & imaging results that were available during my care of the patient were reviewed by me and considered in my medical decision making (see chart for details).     This patient is a 35 year old female presenting with globus sensation following eating beef earlier this morning.  Today she is afebrile, nontachycardic, nontachypneic, oxygenating well and comfortably on room air.  Breath sounds full throughout.  Sucralfate suspension as below for symptomatic relief. Rec good hydration. Strict ED precautions discussed.  This chart was dictated using voice recognition software, Dragon. Despite the best efforts of this provider to proofread and correct errors, errors may still occur which can change documentation meaning.   Final Clinical Impressions(s) / UC Diagnoses   Final diagnoses:  Globus sensation  Essential hypertension     Discharge Instructions     -Use the Carafate (sucralfate) suspension, up to 3 times daily for sensation of food stuck in throat. -If you develop shortness of breath, worsening throat pain with swallowing, difficulty swallowing, worsening symptoms despite treatment, chest pain, dizziness-head to the ER for further  evaluation and management.    ED Prescriptions    Medication Sig Dispense Auth. Provider   sucralfate (CARAFATE) 1 GM/10ML suspension Take 10 mLs (1 g total) by mouth in the morning, at noon, and at bedtime for 9 doses. Up to 3x daily (every 8 hours) for sensation of food stuck in throat 90 mL Rhys Martini, PA-C     PDMP not reviewed this encounter.   Rhys Martini, PA-C 06/05/20 1754

## 2020-06-05 NOTE — Discharge Instructions (Signed)
-  Use the Carafate (sucralfate) suspension, up to 3 times daily for sensation of food stuck in throat. -If you develop shortness of breath, worsening throat pain with swallowing, difficulty swallowing, worsening symptoms despite treatment, chest pain, dizziness-head to the ER for further evaluation and management.

## 2020-06-05 NOTE — ED Triage Notes (Addendum)
Pt reports ate some met stew and states she feels the meat is stuck in her throat and complain her throat is now hurting and tried everything she could to get the meat down. Pt states was seen at urgent care today and got liquid sucralfate and states she scared to take it because she think the meat is going to remain stuck in her throat. Pt denies any difficulty breathing

## 2020-06-06 ENCOUNTER — Emergency Department (HOSPITAL_COMMUNITY): Payer: Medicaid Other

## 2020-06-06 NOTE — Discharge Instructions (Signed)
Your xray does not show any abnormal appearing of the throat where you are having symptoms of the sensation of food being stuck.   Drink lots of fluids, eat soft foods like pasta and advance as tolerated.   Follow up with gastroenterology if not completely better in 1-2 days.   If symptoms worsen - you have severe pain, or if you become unable to swallow liquids or solids without vomiting/regurgitating.

## 2020-06-06 NOTE — ED Provider Notes (Signed)
[facility] EMERGENCY DEPARTMENT Provider Note   CSN: [id number] Arrival date & time: 06/05/20  2333     History No chief complaint on file.   Nicole Hurley is a 35 y.o. female.  Patient to ED for evaluation of possible food stuck in her throat. Yesterday evening (06/05/20) she was eating a stew with meat and she feels a piece of meat got stuck and won't pass through to her stomach. She is able to swallow liquids and some solids but is afraid moe food will become lodged, causing a bigger problem. No vomiting. No SOB, cough, report of choking while eating yesterday. She [facility] and prescribed Sucralfate for symptoms. She has not taken this yet, stating she is afraid it won't go down.   The history is provided by the patient. No language interpreter was used.       Past Medical History:  Diagnosis Date  . Gallstones 09/2017  . Hypertension     Patient Active Problem List   Diagnosis Date Noted  . Cholecystitis 10/21/2017    Past Surgical History:  Procedure Laterality Date  . CESAREAN SECTION    . CHOLECYSTECTOMY N/A 10/22/2017   Procedure: LAPAROSCOPIC CHOLECYSTECTOMY WITH POSSIBLE INTRAOPERATIVE CHOLANGIOGRAM;  Surgeon: Harriette Bouillon, MD;  Location: [facility];  Service: General;  Laterality: N/A;  . DENTAL SURGERY       OB History   No obstetric history on file.     Family History  Problem Relation Age of Onset  . Healthy Mother   . Healthy Father     Social History   Tobacco Use  . Smoking status: Former Smoker    Types: Cigarettes  . Smokeless tobacco: Never Used  Vaping Use  . Vaping Use: Never used  Substance Use Topics  . Alcohol use: Yes  . Drug use: No    Home Medications Prior to Admission medications   Medication Sig Start Date End Date Taking? Authorizing Provider  acetaminophen (TYLENOL) 500 MG tablet Take 2 tablets (1,000 mg total) by mouth every 8 (eight) hours as needed for mild pain. 10/23/17    Focht, Joyce Copa, PA  albuterol (VENTOLIN HFA) 108 (90 Base) MCG/ACT inhaler Inhale 1-2 puffs into the lungs every 6 (six) hours as needed for wheezing or shortness of breath. 04/03/20   Particia Nearing, PA-C  amLODipine (NORVASC) 5 MG tablet Take 5 mg by mouth daily.     [provider]  calcium carbonate (TUMS) 500 MG chewable tablet Chew 500 mg by mouth daily as needed for indigestion or heartburn.     [provider]  diclofenac sodium (VOLTAREN) 1 % GEL Apply 4 g topically 4 (four) times daily. Patient taking differently: Apply 4 g topically 4 (four) times daily as needed (pain/rash).  03/29/17   Gwyneth Sprout, MD  dicyclomine (BENTYL) 20 MG tablet Take 1 tablet (20 mg total) by mouth every 12 (twelve) hours as needed for spasms. Use for abdominal pain/cramping, as prescribed. 10/19/17   Antony Madura, PA-C  ibuprofen (ADVIL) 800 MG tablet Take 1 tablet (800 mg total) by mouth 3 (three) times daily. 01/31/19   Wieters, Hallie C, PA-C  lidocaine (LIDODERM) 5 % Place 1 patch onto the skin daily. Apply to left scapula once per day as needed. Remove & Discard patch within 12 hours 12/25/18   Petrucelli, Samantha R, PA-C  metFORMIN (GLUCOPHAGE) 1000 MG tablet Take 1,000 mg by mouth 2 (two) times daily with a meal.  [provider]  methocarbamol (ROBAXIN) 500 MG tablet Take 1 tablet (500 mg total) by mouth every 8 (eight) hours as needed for muscle spasms. 12/25/18   Petrucelli, Samantha R, PA-C  oxyCODONE (OXY IR/ROXICODONE) 5 MG immediate release tablet Take 1 tablet (5 mg total) by mouth every 6 (six) hours as needed for moderate pain. 10/23/17   Focht, Joyce Copa, PA  promethazine-dextromethorphan (PROMETHAZINE-DM) 6.25-15 MG/5ML syrup Take 5 mLs by mouth 4 (four) times daily as needed for cough. 04/03/20   Particia Nearing, PA-C  Simethicone (GAS RELIEF PO) Take 1 tablet by mouth daily as needed (gas).    [provider]  sucralfate (CARAFATE) 1 GM/10ML  suspension Take 10 mLs (1 g total) by mouth in the morning, at noon, and at bedtime for 9 doses. Up to 3x daily (every 8 hours) for sensation of food stuck in throat 06/05/20 06/08/20  Rhys Martini, PA-C  tiZANidine (ZANAFLEX) 4 MG tablet Take 1 tablet (4 mg total) by mouth every 8 (eight) hours as needed for muscle spasms. 12/23/18   Domenick Gong, MD    Allergies    Benadryl [diphenhydramine hcl (sleep)]  Review of Systems   Review of Systems  Constitutional: Negative for appetite change and fever.  HENT: Positive for trouble swallowing. Negative for congestion and sore throat.   Respiratory: Negative for cough and shortness of breath.   Cardiovascular: Negative for chest pain.  Gastrointestinal: Negative for abdominal pain, nausea and vomiting.  Musculoskeletal: Negative for neck pain.  Neurological: Negative for weakness.    Physical Exam Updated Vital Signs BP (!) 152/93 (BP Location: Left Arm)   Pulse (!) 104   Temp 98.4 F (36.9 C) (Oral)   Resp 18   Ht 5\' 6"  (1.676 m)   Wt (!) 186 kg   LMP 05/16/2020   SpO2 98%   BMI 66.18 kg/m   Physical Exam Vitals and nursing note reviewed.  Constitutional:      Appearance: Normal appearance. She is obese.  Neck:     Comments: No thyromegaly, however, exam limited by fat collection around the neck.  Musculoskeletal:        General: Normal range of motion.     Cervical back: Normal range of motion and neck supple.  Skin:    General: Skin is warm and dry.  Neurological:     General: No focal deficit present.     Mental Status: She is alert and oriented to person, place, and time.     ED Results / Procedures / Treatments   Labs (all labs ordered are listed, but only abnormal results are displayed) Labs Reviewed - No data to display  EKG None  Radiology No results found.  Procedures Procedures   Medications Ordered in ED Medications - No data to display  ED Course  I have reviewed the triage vital signs  and the nursing notes.  Pertinent labs & imaging results that were available during my care of the patient were reviewed by me and considered in my medical decision making (see chart for details).    MDM Rules/Calculators/A&P                          Patient to ED with concern for lodged food bolus after eating meat yesterday.   She is managing her secretions, not spitting, has had no vomiting. She reports having had liquids and soft solids since onset of symptoms that did not come  back up. Doubt esophageal obstruction. The patient is fairly anxious and ED encounter is second medical evaluation since yesterday. Will obtain soft tissue neck.   Soft tissue neck unremarkable. VSS. She can be discharged home and will be referred to GI. Return precautions discussed.   Final Clinical Impression(s) / ED Diagnoses Final diagnoses:  None   1. Dysphagia   Rx / DC Orders ED Discharge Orders    None       Danne Harbor 06/06/20 Vikki Ports, MD 06/06/20 [id number]

## 2020-06-12 ENCOUNTER — Ambulatory Visit: Payer: Medicaid Other | Admitting: Internal Medicine

## 2020-10-03 ENCOUNTER — Ambulatory Visit (HOSPITAL_COMMUNITY)
Admission: EM | Admit: 2020-10-03 | Discharge: 2020-10-03 | Disposition: A | Discharge status: Home / Self Care | Payer: Medicaid Other | Attending: Medical Oncology | Admitting: Medical Oncology

## 2020-10-03 ENCOUNTER — Other Ambulatory Visit: Payer: Self-pay

## 2020-10-03 ENCOUNTER — Ambulatory Visit (INDEPENDENT_AMBULATORY_CARE_PROVIDER_SITE_OTHER): Payer: Medicaid Other

## 2020-10-03 ENCOUNTER — Encounter (HOSPITAL_COMMUNITY): Payer: Self-pay

## 2020-10-03 DIAGNOSIS — W19XXXA Unspecified fall, initial encounter: Secondary | ICD-10-CM | POA: Diagnosis not present

## 2020-10-03 DIAGNOSIS — M545 Low back pain, unspecified: Secondary | ICD-10-CM | POA: Diagnosis not present

## 2020-10-03 DIAGNOSIS — M549 Dorsalgia, unspecified: Secondary | ICD-10-CM

## 2020-10-03 MED ORDER — KETOROLAC TROMETHAMINE 60 MG/2ML IM SOLN
60.0000 mg | Freq: Once | INTRAMUSCULAR | Status: AC
  Administered 2020-10-03: 60 mg via INTRAMUSCULAR

## 2020-10-03 MED ORDER — METHOCARBAMOL 500 MG PO TABS: 500.0000 mg | ORAL_TABLET | Freq: Three times a day (TID) | ORAL | 0 refills | Status: DC | PRN

## 2020-10-03 MED ORDER — KETOROLAC TROMETHAMINE 60 MG/2ML IM SOLN
INTRAMUSCULAR | Status: AC
  Filled 2020-10-03: qty 2

## 2020-10-03 MED ORDER — LIDOCAINE 5 % EX PTCH: 1.0000 | MEDICATED_PATCH | CUTANEOUS | 0 refills | Status: DC

## 2020-10-03 NOTE — ED Provider Notes (Signed)
[facility]    CSN: [id number] Arrival date & time: 10/03/20  1324      History   Chief Complaint Chief Complaint  Patient presents with   Fall   Back Pain    HPI Nicole Hurley is a 35 y.o. female.   HPI  Back Pain: Patient states that on Monday she fell at work.  She reports that she tripped and fell on her knees and hands.  She states that her knees and hands do not hurt but her lower back and sacrum area are very tender.  She states that the pain is a constant throbbing ache rated 8 out of 10 in nature.  Worse when she sits or tries to move.  She denies any history of back injuries, numbness, tingling, loss of sensation of groin, incontinence or neuro changes. NO history of osteoporosis. Of note she reports that this is not a Workmen's Comp. case.  She has taken ibuprofen for symptoms without much relief.  Past Medical History:  Diagnosis Date   Gallstones 09/2017   Hypertension     Patient Active Problem List   Diagnosis Date Noted   Cholecystitis 10/21/2017    Past Surgical History:  Procedure Laterality Date   CESAREAN SECTION     CHOLECYSTECTOMY N/A 10/22/2017   Procedure: LAPAROSCOPIC CHOLECYSTECTOMY WITH POSSIBLE INTRAOPERATIVE CHOLANGIOGRAM;  Surgeon: Harriette Bouillon, MD;  Location: [facility];  Service: General;  Laterality: N/A;   DENTAL SURGERY      OB History   No obstetric history on file.      Home Medications    Prior to Admission medications   Medication Sig Start Date End Date Taking? Authorizing Provider  acetaminophen (TYLENOL) 500 MG tablet Take 2 tablets (1,000 mg total) by mouth every 8 (eight) hours as needed for mild pain. 10/23/17   Focht, Joyce Copa, PA  albuterol (VENTOLIN HFA) 108 (90 Base) MCG/ACT inhaler Inhale 1-2 puffs into the lungs every 6 (six) hours as needed for wheezing or shortness of breath. 04/03/20   Particia Nearing, PA-C  amLODipine (NORVASC) 5 MG tablet Take 5 mg by mouth daily.     [provider]  calcium carbonate (TUMS) 500 MG chewable tablet Chew 500 mg by mouth daily as needed for indigestion or heartburn.     [provider]  diclofenac sodium (VOLTAREN) 1 % GEL Apply 4 g topically 4 (four) times daily. Patient taking differently: Apply 4 g topically 4 (four) times daily as needed (pain/rash).  03/29/17   Gwyneth Sprout, MD  dicyclomine (BENTYL) 20 MG tablet Take 1 tablet (20 mg total) by mouth every 12 (twelve) hours as needed for spasms. Use for abdominal pain/cramping, as prescribed. 10/19/17   Antony Madura, PA-C  ibuprofen (ADVIL) 800 MG tablet Take 1 tablet (800 mg total) by mouth 3 (three) times daily. 01/31/19   Wieters, Hallie C, PA-C  lidocaine (LIDODERM) 5 % Place 1 patch onto the skin daily. Apply to left scapula once per day as needed. Remove & Discard patch within 12 hours 12/25/18   Petrucelli, Samantha R, PA-C  metFORMIN (GLUCOPHAGE) 1000 MG tablet Take 1,000 mg by mouth 2 (two) times daily with a meal.    [provider]  methocarbamol (ROBAXIN) 500 MG tablet Take 1 tablet (500 mg total) by mouth every 8 (eight) hours as needed for muscle spasms. 12/25/18   Petrucelli, Samantha R, PA-C  oxyCODONE (OXY IR/ROXICODONE) 5 MG immediate release tablet Take 1 tablet (5 mg total)  by mouth every 6 (six) hours as needed for moderate pain. 10/23/17   Focht, Joyce Copa, PA  promethazine-dextromethorphan (PROMETHAZINE-DM) 6.25-15 MG/5ML syrup Take 5 mLs by mouth 4 (four) times daily as needed for cough. 04/03/20   Particia Nearing, PA-C  Simethicone (GAS RELIEF PO) Take 1 tablet by mouth daily as needed (gas).    [provider]  sucralfate (CARAFATE) 1 GM/10ML suspension Take 10 mLs (1 g total) by mouth in the morning, at noon, and at bedtime for 9 doses. Up to 3x daily (every 8 hours) for sensation of food stuck in throat 06/05/20 06/08/20  Rhys Martini, PA-C  tiZANidine (ZANAFLEX) 4 MG tablet Take 1 tablet (4 mg total) by mouth every 8 (eight) hours as  needed for muscle spasms. 12/23/18   Domenick Gong, MD    Family History Family History  Problem Relation Age of Onset   Healthy Mother    Healthy Father     Social History Social History   Tobacco Use   Smoking status: Former    Pack years: 0.00    Types: Cigarettes   Smokeless tobacco: Never  Vaping Use   Vaping Use: Never used  Substance Use Topics   Alcohol use: Yes   Drug use: No     Allergies   Benadryl [diphenhydramine hcl (sleep)]   Review of Systems Review of Systems  As stated above in HPI Physical Exam Triage Vital Signs ED Triage Vitals  Enc Vitals Group     BP 10/03/20 1541 138/84     Pulse Rate 10/03/20 1539 95     Resp 10/03/20 1539 20     Temp 10/03/20 1539 98.5 F (36.9 C)     Temp Source 10/03/20 1539 Oral     SpO2 10/03/20 1539 99 %     Weight --      Height --      Head Circumference --      Peak Flow --      Pain Score 10/03/20 1538 10     Pain Loc --      Pain Edu? --      Excl. in [place]? --    No data found.  Updated Vital Signs BP 138/84 (BP Location: Right Wrist)   Pulse 95   Temp 98.5 F (36.9 C) (Oral)   Resp 20   LMP 09/13/2020 (Approximate)   SpO2 99%   Physical Exam Vitals and nursing note reviewed.  Constitutional:      Appearance: Normal appearance. She is obese.  Musculoskeletal:     Cervical back: Normal.     Thoracic back: No spasms, tenderness or bony tenderness. Decreased range of motion.     Lumbar back: Tenderness and bony tenderness present. No swelling, signs of trauma or spasms. Decreased range of motion. Negative right straight leg raise test and negative left straight leg raise test.       Back:  Neurological:     Mental Status: She is alert and oriented to person, place, and time.     Sensory: No sensory deficit.     Motor: No weakness.     Coordination: Coordination normal.     Gait: Gait normal.     Deep Tendon Reflexes: Reflexes normal.     UC Treatments / Results  Labs (all labs  ordered are listed, but only abnormal results are displayed) Labs Reviewed - No data to display  EKG   Radiology No results found.  Procedures Procedures (including critical  care time)  Medications Ordered in UC Medications - No data to display  Initial Impression / Assessment and Plan / UC Course  I have reviewed the triage vital signs and the nursing notes.  Pertinent labs & imaging results that were available during my care of the patient were reviewed by me and considered in my medical decision making (see chart for details).     New.  Given her physical exam findings I am going to obtain an x-ray to ensure no sign of abnormality of her spine.  Update: No radiographical evidence of significant abnormality causing her pain.  She does have some osteoarthritis which could have been aggravated by the fall.  Given that she does have diabetes we are going to avoid prednisone at this time.  Instead we are going to treat with methocarbamol, lidocaine patches and 1 dose of tramadol.  Discussed red flag signs and symptoms.  Follow-up as needed. Final Clinical Impressions(s) / UC Diagnoses   Final diagnoses:  None   Discharge Instructions   None    ED Prescriptions   None    PDMP not reviewed this encounter.   Rushie Chestnut, [place] 10/03/20 1811

## 2020-10-03 NOTE — ED Triage Notes (Signed)
Pt in with c/o lower back pain that started Monday after she fell at work  Pt states she fel forward but is having lower back and tail bone pain  Pt has been taking tylenol and a muscle relaxer

## 2021-03-18 ENCOUNTER — Other Ambulatory Visit: Payer: Self-pay

## 2021-03-18 ENCOUNTER — Ambulatory Visit (HOSPITAL_COMMUNITY)
Admission: EM | Admit: 2021-03-18 | Discharge: 2021-03-18 | Disposition: A | Discharge status: Home / Self Care | Payer: Medicaid Other | Attending: Student | Admitting: Student

## 2021-03-18 ENCOUNTER — Encounter (HOSPITAL_COMMUNITY): Payer: Self-pay

## 2021-03-18 DIAGNOSIS — S39012A Strain of muscle, fascia and tendon of lower back, initial encounter: Secondary | ICD-10-CM | POA: Diagnosis not present

## 2021-03-18 DIAGNOSIS — W19XXXA Unspecified fall, initial encounter: Secondary | ICD-10-CM

## 2021-03-18 MED ORDER — TIZANIDINE HCL 2 MG PO TABS: 2.0000 mg | ORAL_TABLET | Freq: Three times a day (TID) | ORAL | 0 refills | Status: DC | PRN

## 2021-03-18 MED ORDER — LIDOCAINE 5 % EX PTCH: 1.0000 | MEDICATED_PATCH | CUTANEOUS | 0 refills | Status: DC

## 2021-03-18 NOTE — ED Triage Notes (Signed)
Pt states she slipped on ice 2days ago and having lower back pain radiating down rt leg. States taking tylenol and ibuprofen with no relief.

## 2021-03-18 NOTE — Discharge Instructions (Addendum)
-  Start the muscle relaxer-Zanaflex (tizanidine), up to 3 times daily for muscle spasms and pain.  This can make you drowsy, so take at bedtime or when you do not need to drive or operate machinery. -Lidocaine patch once daily. Remove before using heating pad.  -Follow-up  with PCP if symptoms persist. If they can't see you, head to the ED. We unfortunately can't do an xray for this here at urgent care.

## 2021-03-18 NOTE — ED Provider Notes (Signed)
[facility]    CSN: [id number] Arrival date & time: 03/18/21  1246      History   Chief Complaint Chief Complaint  Patient presents with   Back Pain    HPI Nicole Hurley is a 35 y.o. female presenting with lower back pain for 2 days following a fall on ice.  Medical history lower back pain in the past, but denies trauma or injury.  States that she was trying to get into the car, she slipped on ice and fell onto her bottom, now with pain over the sacrum with sitting and also lower back pain.  Has attempted heating pad without relief.  Does endorse some right-sided sciatica, describes this as sharp pain shooting down the back of her right leg, worse with movement and standing.  Distant history of sciatica in the past.  Denies any weakness or sensation changes in arms or legs.  Denies urinary symptoms.  Denies chest pain, shortness of breath, headaches, vision changes, weakness.  HPI  Past Medical History:  Diagnosis Date   Gallstones 09/2017   Hypertension     Patient Active Problem List   Diagnosis Date Noted   Cholecystitis 10/21/2017    Past Surgical History:  Procedure Laterality Date   CESAREAN SECTION     CHOLECYSTECTOMY N/A 10/22/2017   Procedure: LAPAROSCOPIC CHOLECYSTECTOMY WITH POSSIBLE INTRAOPERATIVE CHOLANGIOGRAM;  Surgeon: Harriette Bouillon, MD;  Location: [facility];  Service: General;  Laterality: N/A;   DENTAL SURGERY      OB History   No obstetric history on file.      Home Medications    Prior to Admission medications   Medication Sig Start Date End Date Taking? Authorizing Provider  tiZANidine (ZANAFLEX) 2 MG tablet Take 1 tablet (2 mg total) by mouth every 8 (eight) hours as needed for muscle spasms. 03/18/21  Yes Rhys Martini, PA-C  acetaminophen (TYLENOL) 500 MG tablet Take 2 tablets (1,000 mg total) by mouth every 8 (eight) hours as needed for mild pain. 10/23/17   Focht, Joyce Copa, PA  albuterol (VENTOLIN HFA) 108 (90 Base) MCG/ACT  inhaler Inhale 1-2 puffs into the lungs every 6 (six) hours as needed for wheezing or shortness of breath. 04/03/20   Particia Nearing, PA-C  amLODipine (NORVASC) 5 MG tablet Take 5 mg by mouth daily.     [provider]  calcium carbonate (TUMS) 500 MG chewable tablet Chew 500 mg by mouth daily as needed for indigestion or heartburn.     [provider]  diclofenac sodium (VOLTAREN) 1 % GEL Apply 4 g topically 4 (four) times daily. Patient taking differently: Apply 4 g topically 4 (four) times daily as needed (pain/rash).  03/29/17   Gwyneth Sprout, MD  dicyclomine (BENTYL) 20 MG tablet Take 1 tablet (20 mg total) by mouth every 12 (twelve) hours as needed for spasms. Use for abdominal pain/cramping, as prescribed. 10/19/17   Antony Madura, PA-C  ibuprofen (ADVIL) 800 MG tablet Take 1 tablet (800 mg total) by mouth 3 (three) times daily. 01/31/19   Wieters, Hallie C, PA-C  lidocaine (LIDODERM) 5 % Place 1 patch onto the skin daily. Apply to lower back once per day as needed. Remove & Discard patch within 12 hours 03/18/21   Rhys Martini, PA-C  metFORMIN (GLUCOPHAGE) 1000 MG tablet Take 1,000 mg by mouth 2 (two) times daily with a meal.    [provider]  oxyCODONE (OXY IR/ROXICODONE) 5 MG immediate release tablet Take 1 tablet (  5 mg total) by mouth every 6 (six) hours as needed for moderate pain. 10/23/17   Focht, Fraser Din, PA  promethazine-dextromethorphan (PROMETHAZINE-DM) 6.25-15 MG/5ML syrup Take 5 mLs by mouth 4 (four) times daily as needed for cough. 04/03/20   Volney American, PA-C  Simethicone (GAS RELIEF PO) Take 1 tablet by mouth daily as needed (gas).    [provider]  sucralfate (CARAFATE) 1 GM/10ML suspension Take 10 mLs (1 g total) by mouth in the morning, at noon, and at bedtime for 9 doses. Up to 3x daily (every 8 hours) for sensation of food stuck in throat 06/05/20 06/08/20  Hazel Sams, PA-C    Family History Family History   Problem Relation Age of Onset   Healthy Mother    Healthy Father     Social History Social History   Tobacco Use   Smoking status: Former    Types: Cigarettes   Smokeless tobacco: Never  Vaping Use   Vaping Use: Never used  Substance Use Topics   Alcohol use: Yes   Drug use: No     Allergies   Benadryl [diphenhydramine hcl (sleep)]   Review of Systems Review of Systems  Musculoskeletal:  Positive for back pain.  All other systems reviewed and are negative.   Physical Exam Triage Vital Signs ED Triage Vitals  Enc Vitals Group     BP 03/18/21 1515 (!) 197/111     Pulse Rate 03/18/21 1515 97     Resp 03/18/21 1515 20     Temp 03/18/21 1515 97.8 F (36.6 C)     Temp Source 03/18/21 1515 Oral     SpO2 03/18/21 1515 100 %     Weight --      Height --      Head Circumference --      Peak Flow --      Pain Score 03/18/21 1516 10     Pain Loc --      Pain Edu? --      Excl. in [place]? --    No data found.  Updated Vital Signs BP (!) 197/111 Comment: states stopped her b/p meds   Pulse 97    Temp 97.8 F (36.6 C) (Oral)    Resp 20    LMP 03/02/2021    SpO2 100%   Visual Acuity Right Eye Distance:   Left Eye Distance:   Bilateral Distance:    Right Eye Near:   Left Eye Near:    Bilateral Near:     Physical Exam Vitals reviewed.  Constitutional:      General: She is not in acute distress.    Appearance: Normal appearance. She is not ill-appearing.  HENT:     Head: Normocephalic and atraumatic.  Cardiovascular:     Rate and Rhythm: Normal rate and regular rhythm.     Heart sounds: Normal heart sounds.  Pulmonary:     Effort: Pulmonary effort is normal.     Breath sounds: Normal breath sounds and air entry.  Abdominal:     Tenderness: There is no abdominal tenderness. There is no right CVA tenderness, left CVA tenderness, guarding or rebound.  Musculoskeletal:     Cervical back: Normal range of motion. No swelling, deformity, signs of trauma, rigidity,  spasms, tenderness, bony tenderness or crepitus. No pain with movement.     Thoracic back: No swelling, deformity, signs of trauma, spasms, tenderness or bony tenderness. Normal range of motion. No scoliosis.  Lumbar back: Spasms and tenderness present. No swelling, deformity, signs of trauma or bony tenderness. Normal range of motion. Negative right straight leg raise test and negative left straight leg raise test. No scoliosis.     Comments: Exam limited due to body habitus. Lumbar spinous tenderness without deformity or stepoff. No cervical, thoracic, or sacral tenderness appreciated. Strength and sensation intact upper and lower extremities. No other midline spinous tenderness, deformity, stepoff. Positive R straight leg raise.  Absolutely no other injury, deformity, tenderness, ecchymosis, abrasion.  Neurological:     General: No focal deficit present.     Mental Status: She is alert.     Cranial Nerves: No cranial nerve deficit.  Psychiatric:        Mood and Affect: Mood normal.        Behavior: Behavior normal.        Thought Content: Thought content normal.        Judgment: Judgment normal.     UC Treatments / Results  Labs (all labs ordered are listed, but only abnormal results are displayed) Labs Reviewed - No data to display  EKG   Radiology No results found.  Procedures Procedures (including critical care time)  Medications Ordered in UC Medications - No data to display  Initial Impression / Assessment and Plan / UC Course  I have reviewed the triage vital signs and the nursing notes.  Pertinent labs & imaging results that were available during my care of the patient were reviewed by me and considered in my medical decision making (see chart for details).     This patient is a very pleasant 35 y.o. year old female presenting with lumbar spinous pain following slip and fall on ice x2 days. No red flag symptoms. Given BMI we are unfortunately not able to perform  xray at this urgent care. Patient prefers to proceed with lidocaine patch and muscle relaxer, and close f/u with PCP/ outpatient imaging if symptoms persist. States she is not pregnant or breastfeeding. ED return precautions discussed. Patient verbalizes understanding and agreement.   Final Clinical Impressions(s) / UC Diagnoses   Final diagnoses:  Strain of lumbar region, initial encounter  Fall, initial encounter     Discharge Instructions      -Start the muscle relaxer-Zanaflex (tizanidine), up to 3 times daily for muscle spasms and pain.  This can make you drowsy, so take at bedtime or when you do not need to drive or operate machinery. -Lidocaine patch once daily. Remove before using heating pad.  -Follow-up  with PCP if symptoms persist. If they can't see you, head to the ED. We unfortunately can't do an xray for this here at urgent care.    ED Prescriptions     Medication Sig Dispense Auth. Provider   tiZANidine (ZANAFLEX) 2 MG tablet Take 1 tablet (2 mg total) by mouth every 8 (eight) hours as needed for muscle spasms. 21 tablet Hazel Sams, PA-C   lidocaine (LIDODERM) 5 % Place 1 patch onto the skin daily. Apply to lower back once per day as needed. Remove & Discard patch within 12 hours 15 patch Hazel Sams, PA-C      PDMP not reviewed this encounter.   Hazel Sams, PA-C 03/18/21 1614

## 2021-09-21 ENCOUNTER — Encounter (HOSPITAL_COMMUNITY): Payer: Self-pay

## 2021-09-21 ENCOUNTER — Other Ambulatory Visit: Payer: Self-pay

## 2021-09-21 ENCOUNTER — Emergency Department (HOSPITAL_COMMUNITY): Payer: Medicaid Other

## 2021-09-21 ENCOUNTER — Emergency Department (HOSPITAL_COMMUNITY)
Admission: EM | Admit: 2021-09-21 | Discharge: 2021-09-21 | Disposition: A | Discharge status: Home / Self Care | Payer: Medicaid Other | Attending: Emergency Medicine | Admitting: Emergency Medicine

## 2021-09-21 DIAGNOSIS — Z7984 Long term (current) use of oral hypoglycemic drugs: Secondary | ICD-10-CM | POA: Diagnosis not present

## 2021-09-21 DIAGNOSIS — Z79899 Other long term (current) drug therapy: Secondary | ICD-10-CM | POA: Insufficient documentation

## 2021-09-21 DIAGNOSIS — W208XXA Other cause of strike by thrown, projected or falling object, initial encounter: Secondary | ICD-10-CM | POA: Diagnosis not present

## 2021-09-21 DIAGNOSIS — L84 Corns and callosities: Secondary | ICD-10-CM | POA: Diagnosis not present

## 2021-09-21 DIAGNOSIS — Y990 Civilian activity done for income or pay: Secondary | ICD-10-CM | POA: Diagnosis not present

## 2021-09-21 DIAGNOSIS — I10 Essential (primary) hypertension: Secondary | ICD-10-CM | POA: Insufficient documentation

## 2021-09-21 DIAGNOSIS — S99922A Unspecified injury of left foot, initial encounter: Secondary | ICD-10-CM | POA: Diagnosis present

## 2021-09-21 DIAGNOSIS — S9782XA Crushing injury of left foot, initial encounter: Secondary | ICD-10-CM

## 2021-09-21 MED ORDER — OXYCODONE-ACETAMINOPHEN 5-325 MG PO TABS
1.0000 | ORAL_TABLET | Freq: Once | ORAL | Status: AC
  Administered 2021-09-21: 1 via ORAL
  Filled 2021-09-21: qty 1

## 2021-09-21 NOTE — ED Triage Notes (Signed)
Patient complains of left toe pain after dropping table on same today at work. Patient reports pain with ambulation

## 2021-09-21 NOTE — ED Provider Notes (Signed)
[facility] EMERGENCY DEPARTMENT Provider Note   CSN: [id number] Arrival date & time: 09/21/21  1345     History  No chief complaint on file.   Nicole Hurley is a 36 y.o. female with a history of hypertension.  Presents to the emergency department complaining of left foot injury.  Patient states that today while at work approximately 12:30 PM a coffee table fell out of her grip and landed on her left foot.  Patient reports that she has had pain to the base of her left big toe since then.  Patient describes pain as a "throbbing."  Patient rates pain 10/10 on the pain scale.  Pain is worse with touch and movement.  Patient reports taking Tylenol earlier today with no improvement in her pain.  Patient denies any falls or head injuries surrounding her left foot injury.  Patient reports that she has a history of hypertension.  Is noncompliant with her amlodipine medication.  Denies any color change, pallor, wound, numbness, weakness.  HPI     Home Medications Prior to Admission medications   Medication Sig Start Date End Date Taking? Authorizing Provider  acetaminophen (TYLENOL) 500 MG tablet Take 2 tablets (1,000 mg total) by mouth every 8 (eight) hours as needed for mild pain. 10/23/17   Focht, Joyce Copa, PA  albuterol (VENTOLIN HFA) 108 (90 Base) MCG/ACT inhaler Inhale 1-2 puffs into the lungs every 6 (six) hours as needed for wheezing or shortness of breath. 04/03/20   Particia Nearing, PA-C  amLODipine (NORVASC) 5 MG tablet Take 5 mg by mouth daily.     [provider]  calcium carbonate (TUMS) 500 MG chewable tablet Chew 500 mg by mouth daily as needed for indigestion or heartburn.     [provider]  diclofenac sodium (VOLTAREN) 1 % GEL Apply 4 g topically 4 (four) times daily. Patient taking differently: Apply 4 g topically 4 (four) times daily as needed (pain/rash).  03/29/17   Gwyneth Sprout, MD  dicyclomine (BENTYL) 20 MG tablet Take  1 tablet (20 mg total) by mouth every 12 (twelve) hours as needed for spasms. Use for abdominal pain/cramping, as prescribed. 10/19/17   Antony Madura, PA-C  ibuprofen (ADVIL) 800 MG tablet Take 1 tablet (800 mg total) by mouth 3 (three) times daily. 01/31/19   Wieters, Hallie C, PA-C  lidocaine (LIDODERM) 5 % Place 1 patch onto the skin daily. Apply to lower back once per day as needed. Remove & Discard patch within 12 hours 03/18/21   Rhys Martini, PA-C  metFORMIN (GLUCOPHAGE) 1000 MG tablet Take 1,000 mg by mouth 2 (two) times daily with a meal.    [provider]  oxyCODONE (OXY IR/ROXICODONE) 5 MG immediate release tablet Take 1 tablet (5 mg total) by mouth every 6 (six) hours as needed for moderate pain. 10/23/17   Focht, Joyce Copa, PA  promethazine-dextromethorphan (PROMETHAZINE-DM) 6.25-15 MG/5ML syrup Take 5 mLs by mouth 4 (four) times daily as needed for cough. 04/03/20   Particia Nearing, PA-C  Simethicone (GAS RELIEF PO) Take 1 tablet by mouth daily as needed (gas).    [provider]  sucralfate (CARAFATE) 1 GM/10ML suspension Take 10 mLs (1 g total) by mouth in the morning, at noon, and at bedtime for 9 doses. Up to 3x daily (every 8 hours) for sensation of food stuck in throat 06/05/20 06/08/20  Rhys Martini, PA-C  tiZANidine (ZANAFLEX) 2 MG tablet Take 1 tablet (2 mg total)  by mouth every 8 (eight) hours as needed for muscle spasms. 03/18/21   Rhys Martini, PA-C      Allergies    Benadryl [diphenhydramine hcl (sleep)]    Review of Systems   Review of Systems  Constitutional:  Negative for chills and fever.  Musculoskeletal:  Positive for arthralgias. Negative for back pain and neck pain.  Skin:  Negative for color change, pallor, rash and wound.  Neurological:  Negative for weakness and numbness.    Physical Exam Updated Vital Signs BP (!) 180/99 (BP Location: Right Wrist)   Pulse 100   Temp 98.6 F (37 C) (Oral)   Resp 17   SpO2 98%  Physical  Exam Cardiovascular:     Pulses:          Dorsalis pedis pulses are 2+ on the right side and 2+ on the left side.  Musculoskeletal:     Right ankle: No swelling, deformity, ecchymosis or lacerations. No tenderness. Normal range of motion.     Left ankle: No swelling, deformity, ecchymosis or lacerations. No tenderness. Normal range of motion.     Right foot: Normal range of motion and normal capillary refill. No swelling, deformity, laceration, tenderness, bony tenderness or crepitus. Normal pulse.     Left foot: Normal range of motion and normal capillary refill. Tenderness and bony tenderness present. No swelling, deformity, laceration or crepitus. Normal pulse.     Comments: Tenderness to left great toe.  Pain with range of motion.  No swelling or deformity noted.  Feet:     Right foot:     Skin integrity: Callus and dry skin present. No ulcer, blister, skin breakdown, erythema, warmth or fissure.     Toenail Condition: Right toenails are normal.     Left foot:     Skin integrity: Callus and dry skin present. No ulcer, blister, skin breakdown, erythema, warmth or fissure.     Toenail Condition: Left toenails are normal.     ED Results / Procedures / Treatments   Labs (all labs ordered are listed, but only abnormal results are displayed) Labs Reviewed - No data to display  EKG None  Radiology DG Foot Complete Left  Result Date: 09/21/2021 CLINICAL DATA:  Table fell along the left foot 2 hours ago. Left foot pain. EXAM: LEFT FOOT - COMPLETE 3+ VIEW COMPARISON:  None Available. FINDINGS: No fracture.  No bone lesion. Joints are normally aligned. Moderate-sized plantar and dorsal calcaneal spurs. Mild dorsal forefoot soft tissue swelling. IMPRESSION: No fracture or dislocation. Electronically Signed   By: Amie Portland M.D.   On: 09/21/2021 15:08    Procedures Procedures    Medications Ordered in ED Medications - No data to display  ED Course/ Medical Decision Making/ A&P                            Medical Decision Making Amount and/or Complexity of Data Reviewed Radiology: ordered.  Risk Prescription drug management.   Alert 36 year old female no acute distress, nontoxic-appearing.  Presents emergency department for complaint of left foot injury.  Information obtained from patient.  I reviewed patient's past medical records including previous prior notes, labs, and imaging.  Patient has medical history of hypertension which complicates her care.  Patient has history of hypertension and is noncompliant with her medication.  Suspect this is why patient's blood pressure is elevated as well as increased PPT secondary to complaints of pain.  Patient  reports that she will follow-up with PCP in the outpatient setting for further management of her blood pressure.  Importance of further hypertension management was stressed to the patient.  With complaints of pain to left great toe after injury concern for acute osseous abnormality.  X-ray imaging was obtained.  I personally viewed and interpreted patient's x-ray imaging.  Imaging shows no acute osseous abnormality.  We will give patient Percocet pain medication in the emergency department to help with her pain.  Patient given crutches to use as needed.  Discussed symptomatic treatment with elevation, ice, and over-the-counter pain medication.  Patient given information follow-up with orthopedic provider if her pain does not improve.  Based on patient's chief complaint, I considered admission might be necessary, however after reassuring ED workup feel patient is reasonable for discharge.  Discussed results, findings, treatment and follow up. Patient advised of return precautions. Patient verbalized understanding and agreed with plan.  Portions of this note were generated with Scientist, clinical (histocompatibility and immunogenetics). Dictation errors may occur despite best attempts at proofreading.         Final Clinical Impression(s) / ED  Diagnoses Final diagnoses:  None    Rx / DC Orders ED Discharge Orders     None         Haskel Schroeder, PA-C 09/21/21 1545    Vanetta Mulders, MD 09/22/21 1557

## 2021-09-21 NOTE — ED Notes (Signed)
Discharge instructions reviewed with patient. Patient verbalized understanding of instructions. Follow-up care and medications were reviewed. Patient ambulatory with steady gait. VSS upon discharge.  ?

## 2021-09-21 NOTE — Discharge Instructions (Addendum)
You came to the [facility] today to be evaluated for your left foot injury.  The x-ray obtained did not show any broken bones or dislocations.  You may use crutches to remain nonweightbearing as needed.  Please elevate, ice, and use Tylenol/ibuprofen as we discussed.  I have given you information to follow-up with the orthopedic provider if your pain does not improve in the next 7 to 10 days.  Please take Ibuprofen (Advil, motrin) and Tylenol (acetaminophen) to relieve your pain.    You may take up to 600 MG (3 pills) of normal strength ibuprofen every 8 hours as needed.   You make take tylenol, up to 1,000 mg (two extra strength pills) every 8 hours as needed.   It is safe to take ibuprofen and tylenol at the same time as they work differently.   Do not take more than 3,000 mg tylenol in a 24 hour period (not more than one dose every 8 hours.  Please check all medication labels as many medications such as pain and cold medications may contain tylenol.  Do not drink alcohol while taking these medications.  Do not take other NSAID'S while taking ibuprofen (such as aleve or naproxen).  Please take ibuprofen with food to decrease stomach upset.  While in the emergency department your blood pressure was found to be elevated.  Please follow-up with your primary care doctor for further management of your hypertension.  Get help right away if: You suddenly develop severe pain in your foot. You previously had sensation in your foot and you suddenly lose sensation. Your symptoms had improved and they suddenly get worse. Your foot or toes are turning pink or blue.

## 2021-11-29 ENCOUNTER — Ambulatory Visit (HOSPITAL_COMMUNITY)
Admission: EM | Admit: 2021-11-29 | Discharge: 2021-11-29 | Disposition: A | Discharge status: Home / Self Care | Payer: Medicaid Other | Attending: Emergency Medicine | Admitting: Emergency Medicine

## 2021-11-29 ENCOUNTER — Encounter (HOSPITAL_COMMUNITY): Payer: Self-pay | Admitting: Emergency Medicine

## 2021-11-29 DIAGNOSIS — K529 Noninfective gastroenteritis and colitis, unspecified: Secondary | ICD-10-CM | POA: Diagnosis not present

## 2021-11-29 DIAGNOSIS — A059 Bacterial foodborne intoxication, unspecified: Secondary | ICD-10-CM | POA: Diagnosis not present

## 2021-11-29 MED ORDER — ONDANSETRON HCL 4 MG PO TABS: 4.0000 mg | ORAL_TABLET | Freq: Four times a day (QID) | ORAL | 0 refills | Status: DC | PRN

## 2021-11-29 MED ORDER — ONDANSETRON 4 MG PO TBDP
ORAL_TABLET | ORAL | Status: AC
  Filled 2021-11-29: qty 1

## 2021-11-29 MED ORDER — ONDANSETRON 4 MG PO TBDP
4.0000 mg | ORAL_TABLET | Freq: Once | ORAL | Status: AC
  Administered 2021-11-29: 4 mg via ORAL

## 2021-11-29 NOTE — ED Triage Notes (Signed)
Pt reports eating [employer] last night and had abd cramping n/v/d.

## 2021-11-29 NOTE — Discharge Instructions (Addendum)
Make sure to increase your fluid intake over the next few days.  It is important to stay hydrated while you are experiencing vomiting and diarrhea.  Your symptoms may last 24 to 48 hours.  You can take the Zofran every 6 hours as needed to help settle the stomach and control nausea.  If symptoms persist past 3 to 4 days and you are having persistent nausea/vomiting, please go to the emergency department.

## 2021-11-29 NOTE — ED Provider Notes (Signed)
[facility]    CSN: [id number] Arrival date & time: 11/29/21  1932     History   Chief Complaint Chief Complaint  Patient presents with   Abdominal Pain   Diarrhea    HPI Nicole Hurley is a 36 y.o. female.  Presents with abdominal cramping, nausea, vomiting, diarrhea. Symptoms began last night a few hours after eating [employer]. Reports liquid stools. She has been able to tolerate p.o. fluids  Past Medical History:  Diagnosis Date   Gallstones 09/2017   Hypertension     Patient Active Problem List   Diagnosis Date Noted   Cholecystitis 10/21/2017    Past Surgical History:  Procedure Laterality Date   CESAREAN SECTION     CHOLECYSTECTOMY N/A 10/22/2017   Procedure: LAPAROSCOPIC CHOLECYSTECTOMY WITH POSSIBLE INTRAOPERATIVE CHOLANGIOGRAM;  Surgeon: Harriette Bouillon, MD;  Location: [facility] OR;  Service: General;  Laterality: N/A;   DENTAL SURGERY      OB History   No obstetric history on file.      Home Medications    Prior to Admission medications   Medication Sig Start Date End Date Taking? Authorizing Provider  ondansetron (ZOFRAN) 4 MG tablet Take 1 tablet (4 mg total) by mouth every 6 (six) hours as needed for nausea or vomiting. 11/29/21  Yes Bohden Dung, Lurena Joiner, PA-C  acetaminophen (TYLENOL) 500 MG tablet Take 2 tablets (1,000 mg total) by mouth every 8 (eight) hours as needed for mild pain. 10/23/17   Focht, Joyce Copa, PA  albuterol (VENTOLIN HFA) 108 (90 Base) MCG/ACT inhaler Inhale 1-2 puffs into the lungs every 6 (six) hours as needed for wheezing or shortness of breath. 04/03/20   Particia Nearing, PA-C  amLODipine (NORVASC) 5 MG tablet Take 5 mg by mouth daily.     [provider]  calcium carbonate (TUMS) 500 MG chewable tablet Chew 500 mg by mouth daily as needed for indigestion or heartburn.     [provider]  metFORMIN (GLUCOPHAGE) 1000 MG tablet Take 1,000 mg by mouth 2 (two) times daily with a meal.    [provider]  Simethicone (GAS RELIEF PO) Take 1 tablet by mouth daily as needed (gas).    [provider]    Family History Family History  Problem Relation Age of Onset   Healthy Mother    Healthy Father     Social History Social History   Tobacco Use   Smoking status: Former    Types: Cigarettes   Smokeless tobacco: Never  Vaping Use   Vaping Use: Never used  Substance Use Topics   Alcohol use: Yes   Drug use: No     Allergies   Benadryl [diphenhydramine hcl (sleep)]   Review of Systems Review of Systems  Gastrointestinal:  Positive for abdominal pain and diarrhea.   Per HPI  Physical Exam Triage Vital Signs ED Triage Vitals  Enc Vitals Group     BP 11/29/21 1951 (!) 156/87     Pulse Rate 11/29/21 1951 97     Resp 11/29/21 1951 (!) 22     Temp 11/29/21 1951 98.3 F (36.8 C)     Temp Source 11/29/21 1951 Oral     SpO2 11/29/21 1951 99 %     Weight --      Height --      Head Circumference --      Peak Flow --      Pain Score 11/29/21 1950 9     Pain Loc --  Pain Edu? --      Excl. in GC? --    No data found.  Updated Vital Signs BP (!) 156/87 (BP Location: Right Arm)   Pulse 97   Temp 98.3 F (36.8 C) (Oral)   Resp (!) 22   LMP 11/26/2021   SpO2 99%    Physical Exam Vitals and nursing note reviewed.  Constitutional:      Appearance: Normal appearance.  HENT:     Mouth/Throat:     Mouth: Mucous membranes are moist.     Pharynx: Oropharynx is clear.  Eyes:     Conjunctiva/sclera: Conjunctivae normal.  Cardiovascular:     Rate and Rhythm: Normal rate and regular rhythm.     Heart sounds: Normal heart sounds.  Pulmonary:     Effort: Pulmonary effort is normal. No respiratory distress.     Breath sounds: Normal breath sounds.  Abdominal:     General: Bowel sounds are normal.     Palpations: Abdomen is soft.     Tenderness: There is no abdominal tenderness. There is no guarding or rebound.  Musculoskeletal:         General: Normal range of motion.  Skin:    General: Skin is warm and dry.  Neurological:     Mental Status: She is alert and oriented to person, place, and time.      UC Treatments / Results  Labs (all labs ordered are listed, but only abnormal results are displayed) Labs Reviewed - No data to display  EKG   Radiology No results found.  Procedures Procedures (including critical care time)  Medications Ordered in UC Medications  ondansetron (ZOFRAN-ODT) disintegrating tablet 4 mg (4 mg Oral Given 11/29/21 2018)    Initial Impression / Assessment and Plan / UC Course  I have reviewed the triage vital signs and the nursing notes.  Pertinent labs & imaging results that were available during my care of the patient were reviewed by me and considered in my medical decision making (see chart for details).  Likely gastroenteritis/food poisoning Zofran dose given in clinic with improvement in symptoms.  She is able to tolerate water by mouth.  Recommend increasing fluids over the next few days, Zofran as needed to settle the stomach.  Bland diet as tolerated.  Return precautions were discussed, patient agrees to plan.  Final Clinical Impressions(s) / UC Diagnoses   Final diagnoses:  Gastroenteritis  Food poisoning     Discharge Instructions      Make sure to increase your fluid intake over the next few days.  It is important to stay hydrated while you are experiencing vomiting and diarrhea.  Your symptoms may last 24 to 48 hours.  You can take the Zofran every 6 hours as needed to help settle the stomach and control nausea.  If symptoms persist past 3 to 4 days and you are having persistent nausea/vomiting, please go to the emergency department.     ED Prescriptions     Medication Sig Dispense Auth. Provider   ondansetron (ZOFRAN) 4 MG tablet Take 1 tablet (4 mg total) by mouth every 6 (six) hours as needed for nausea or vomiting. 12 tablet Johnmichael Melhorn, Lurena Joiner, PA-C       PDMP not reviewed this encounter.   Maahir Horst, Ray Church 11/29/21 2036

## 2022-01-11 IMAGING — CR DG NECK SOFT TISSUE
2 series · 2 of 2 positions shown · non-contrast
Comparison: None.

CLINICAL DATA: Dysphagia throat pain

EXAM:
NECK SOFT TISSUES - 1+ VIEW

[neck lat]
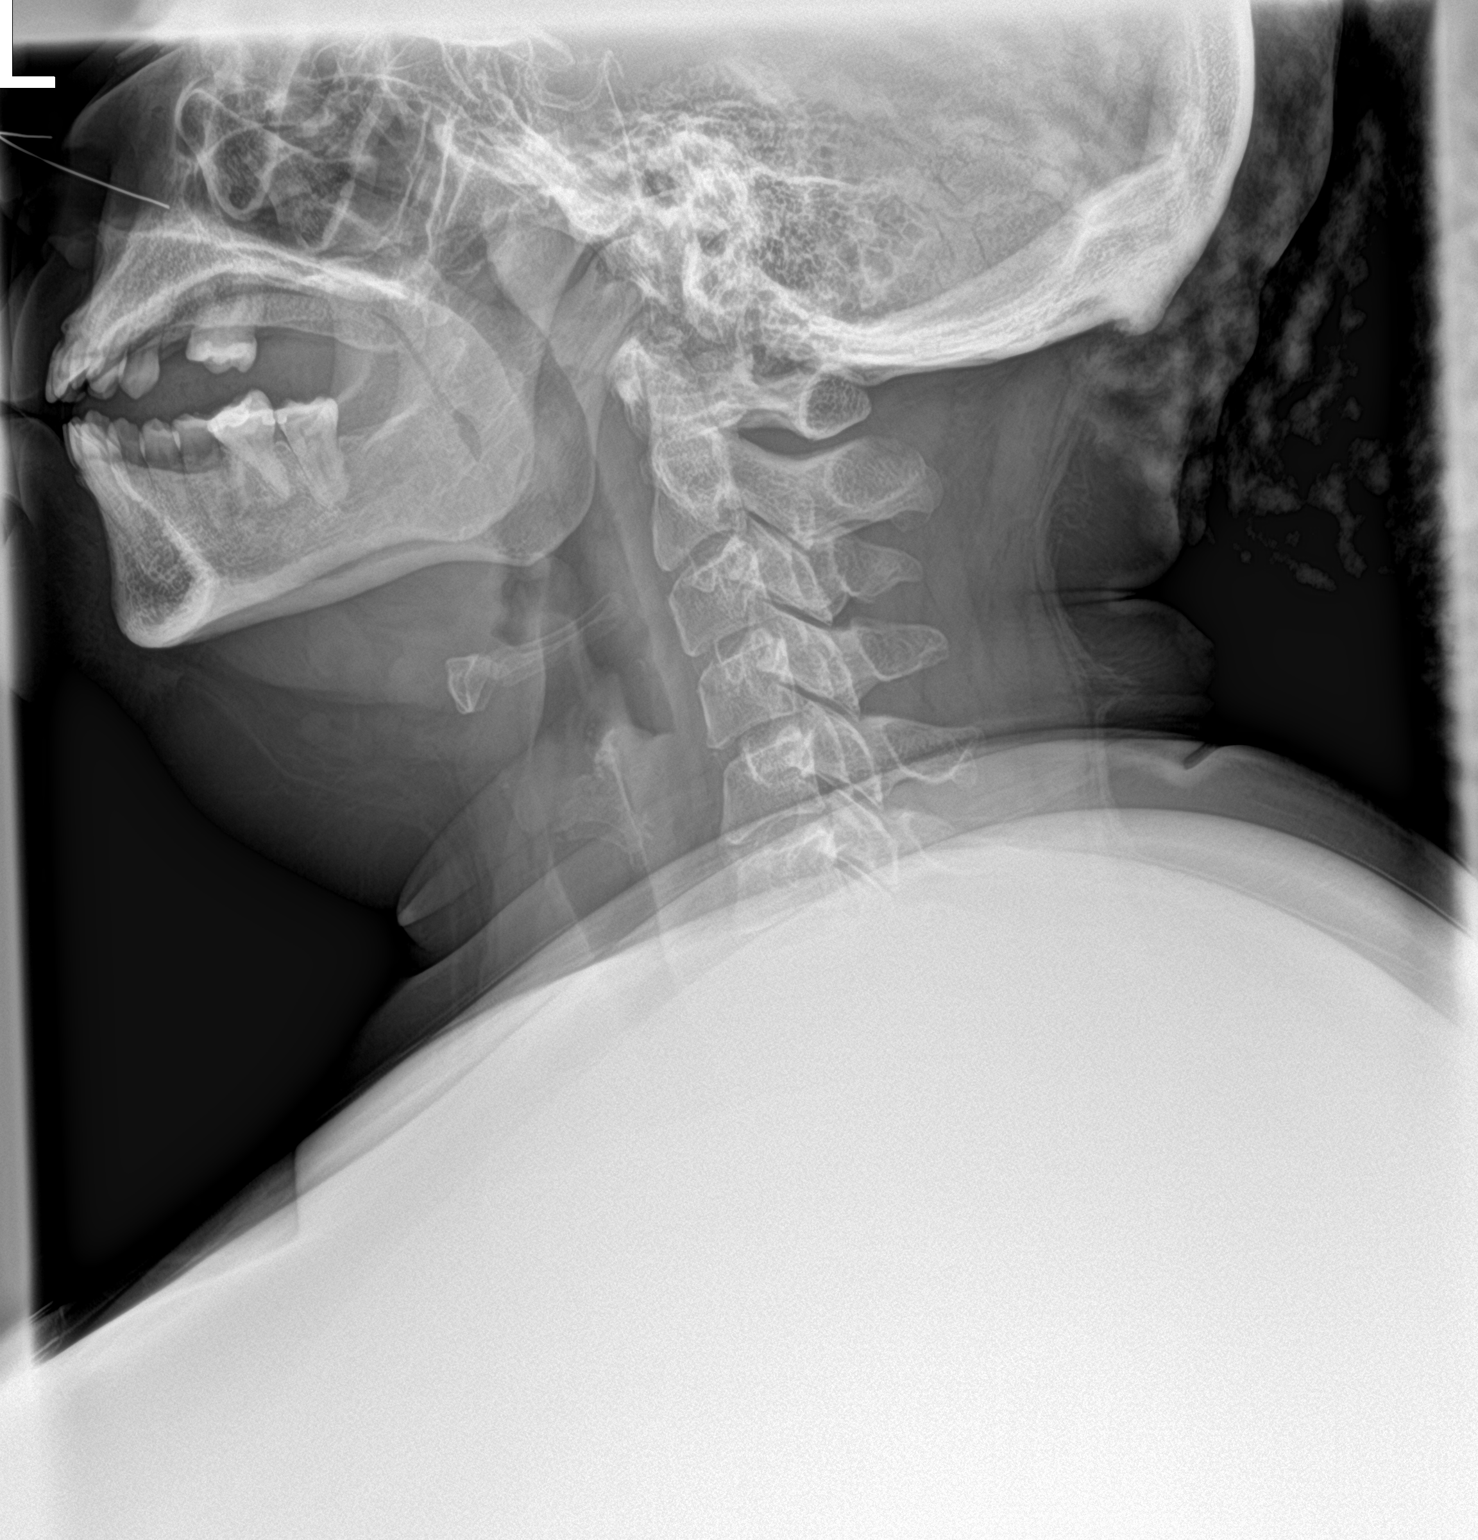

[neck ap]
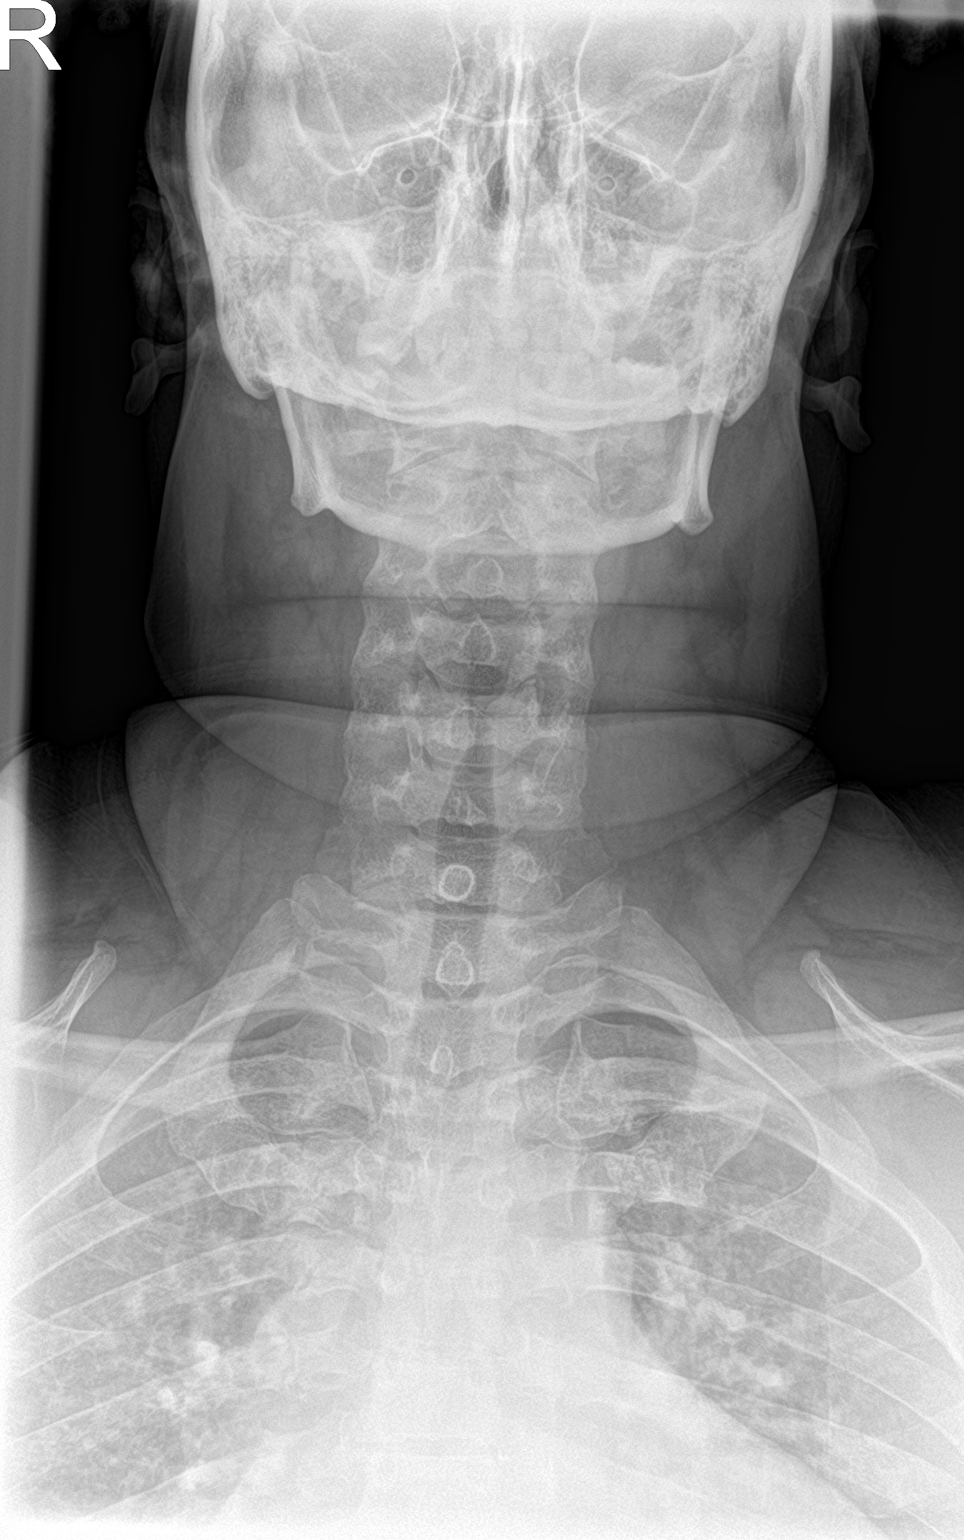

[2 of 2 positions shown; findings below may reference images not displayed]

FINDINGS: There is no evidence of retropharyngeal soft tissue swelling or
epiglottic enlargement. The cervical airway is unremarkable and no
radio-opaque foreign body identified.
IMPRESSION: Negative.

## 2022-09-12 ENCOUNTER — Observation Stay (HOSPITAL_BASED_OUTPATIENT_CLINIC_OR_DEPARTMENT_OTHER)
Admission: EM | Admit: 2022-09-12 | Discharge: 2022-09-13 | Disposition: A | Discharge status: Home / Self Care | Payer: Medicaid Other | Attending: Internal Medicine | Admitting: Internal Medicine

## 2022-09-12 ENCOUNTER — Other Ambulatory Visit: Payer: Self-pay

## 2022-09-12 ENCOUNTER — Encounter (HOSPITAL_BASED_OUTPATIENT_CLINIC_OR_DEPARTMENT_OTHER): Payer: Self-pay

## 2022-09-12 ENCOUNTER — Emergency Department (HOSPITAL_BASED_OUTPATIENT_CLINIC_OR_DEPARTMENT_OTHER): Payer: Medicaid Other

## 2022-09-12 DIAGNOSIS — Z79899 Other long term (current) drug therapy: Secondary | ICD-10-CM | POA: Insufficient documentation

## 2022-09-12 DIAGNOSIS — R5383 Other fatigue: Secondary | ICD-10-CM | POA: Diagnosis present

## 2022-09-12 DIAGNOSIS — D649 Anemia, unspecified: Principal | ICD-10-CM | POA: Insufficient documentation

## 2022-09-12 DIAGNOSIS — Z87891 Personal history of nicotine dependence: Secondary | ICD-10-CM | POA: Diagnosis not present

## 2022-09-12 DIAGNOSIS — D500 Iron deficiency anemia secondary to blood loss (chronic): Secondary | ICD-10-CM | POA: Diagnosis present

## 2022-09-12 DIAGNOSIS — I10 Essential (primary) hypertension: Secondary | ICD-10-CM | POA: Diagnosis not present

## 2022-09-12 DIAGNOSIS — Z7984 Long term (current) use of oral hypoglycemic drugs: Secondary | ICD-10-CM | POA: Insufficient documentation

## 2022-09-12 DIAGNOSIS — R519 Headache, unspecified: Secondary | ICD-10-CM

## 2022-09-12 DIAGNOSIS — Z6841 Body Mass Index (BMI) 40.0 and over, adult: Secondary | ICD-10-CM | POA: Diagnosis not present

## 2022-09-12 LAB — HEPATIC FUNCTION PANEL
ALT: 11 U/L (ref 0–44)
AST: 13 U/L — ABNORMAL LOW (ref 15–41)
Albumin: 3.3 g/dL — ABNORMAL LOW (ref 3.5–5.0)
Alkaline Phosphatase: 79 U/L (ref 38–126)
Bilirubin, Direct: <0.1 mg/dL (ref 0.0–0.2)
Total Bilirubin: 0.3 mg/dL (ref 0.3–1.2)
Total Protein: 8.3 g/dL — ABNORMAL HIGH (ref 6.5–8.1)

## 2022-09-12 LAB — CBC
HCT: 26.3 % — ABNORMAL LOW (ref 36.0–46.0)
Hemoglobin: 6.9 g/dL — CL (ref 12.0–15.0)
MCH: 17.8 pg — ABNORMAL LOW (ref 26.0–34.0)
MCHC: 26.2 g/dL — ABNORMAL LOW (ref 30.0–36.0)
MCV: 68 fL — ABNORMAL LOW (ref 80.0–100.0)
Platelets: 353 10*3/uL (ref 150–400)
RBC: 3.87 MIL/uL (ref 3.87–5.11)
RDW: 24.7 % — ABNORMAL HIGH (ref 11.5–15.5)
WBC: 10.6 10*3/uL — ABNORMAL HIGH (ref 4.0–10.5)
nRBC: 0.2 % (ref 0.0–0.2)

## 2022-09-12 LAB — TSH: TSH: 0.798 u[IU]/mL (ref 0.350–4.500)

## 2022-09-12 LAB — FERRITIN: Ferritin: 4 ng/mL — ABNORMAL LOW (ref 11–307)

## 2022-09-12 LAB — BASIC METABOLIC PANEL
Anion gap: 8 (ref 5–15)
BUN: 7 mg/dL (ref 6–20)
CO2: 27 mmol/L (ref 22–32)
Calcium: 8.9 mg/dL (ref 8.9–10.3)
Chloride: 101 mmol/L (ref 98–111)
Creatinine, Ser: 0.48 mg/dL (ref 0.44–1.00)
GFR, Estimated: >60 mL/min (ref >60)
Glucose, Bld: 146 mg/dL — ABNORMAL HIGH (ref 70–99)
Potassium: 3.5 mmol/L (ref 3.5–5.1)
Sodium: 136 mmol/L (ref 135–145)

## 2022-09-12 LAB — RETICULOCYTES
Immature Retic Fract: 29.9 % — ABNORMAL HIGH (ref 2.3–15.9)
RBC.: 3.62 MIL/uL — ABNORMAL LOW (ref 3.87–5.11)
Retic Count, Absolute: 72.4 10*3/uL (ref 19.0–186.0)
Retic Ct Pct: 2 % (ref 0.4–3.1)

## 2022-09-12 LAB — FOLATE: Folate: 8.3 ng/mL (ref >5.9)

## 2022-09-12 LAB — IRON AND TIBC
Iron: 16 ug/dL — ABNORMAL LOW (ref 28–170)
Saturation Ratios: 4 % — ABNORMAL LOW (ref 10.4–31.8)
TIBC: 412 ug/dL (ref 250–450)
UIBC: 396 ug/dL

## 2022-09-12 LAB — HCG, QUANTITATIVE, PREGNANCY: hCG, Beta Chain, Quant, S: <1 m[IU]/mL (ref <5)

## 2022-09-12 LAB — PREPARE RBC (CROSSMATCH)

## 2022-09-12 LAB — VITAMIN B12: Vitamin B-12: 502 pg/mL (ref 180–914)

## 2022-09-12 LAB — OCCULT BLOOD X 1 CARD TO LAB, STOOL: Fecal Occult Bld: NEGATIVE

## 2022-09-12 LAB — BRAIN NATRIURETIC PEPTIDE: B Natriuretic Peptide: 14.8 pg/mL (ref 0.0–100.0)

## 2022-09-12 LAB — CBG MONITORING, ED: Glucose-Capillary: 119 mg/dL — ABNORMAL HIGH (ref 70–99)

## 2022-09-12 MED ORDER — ACETAMINOPHEN 500 MG PO TABS
1000.0000 mg | ORAL_TABLET | Freq: Four times a day (QID) | ORAL | Status: DC | PRN
  Administered 2022-09-13 (×2): 1000 mg via ORAL
  Filled 2022-09-12 (×2): qty 2

## 2022-09-12 MED ORDER — ACETAMINOPHEN 325 MG PO TABS: 650.0000 mg | ORAL_TABLET | Freq: Four times a day (QID) | ORAL | Status: DC | PRN

## 2022-09-12 MED ORDER — MORPHINE SULFATE (PF) 4 MG/ML IV SOLN
4.0000 mg | Freq: Once | INTRAVENOUS | Status: AC
  Administered 2022-09-12: 4 mg via INTRAVENOUS
  Filled 2022-09-12: qty 1

## 2022-09-12 MED ORDER — SODIUM CHLORIDE 0.9 % IV BOLUS: 1000.0000 mL | Freq: Once | INTRAVENOUS | Status: DC

## 2022-09-12 MED ORDER — ONDANSETRON HCL 4 MG PO TABS: 4.0000 mg | ORAL_TABLET | Freq: Four times a day (QID) | ORAL | Status: DC | PRN

## 2022-09-12 MED ORDER — METOCLOPRAMIDE HCL 5 MG/ML IJ SOLN
10.0000 mg | Freq: Once | INTRAMUSCULAR | Status: AC
  Administered 2022-09-12: 10 mg via INTRAVENOUS
  Filled 2022-09-12: qty 2

## 2022-09-12 MED ORDER — ACETAMINOPHEN 650 MG RE SUPP: 650.0000 mg | Freq: Four times a day (QID) | RECTAL | Status: DC | PRN

## 2022-09-12 MED ORDER — SODIUM CHLORIDE 0.9% IV SOLUTION: Freq: Once | INTRAVENOUS | Status: AC

## 2022-09-12 MED ORDER — KETOROLAC TROMETHAMINE 30 MG/ML IJ SOLN
15.0000 mg | Freq: Once | INTRAMUSCULAR | Status: AC
  Administered 2022-09-12: 15 mg via INTRAVENOUS
  Filled 2022-09-12: qty 1

## 2022-09-12 MED ORDER — HYDROXYZINE HCL 10 MG PO TABS
10.0000 mg | ORAL_TABLET | Freq: Three times a day (TID) | ORAL | Status: DC | PRN
  Filled 2022-09-12: qty 1

## 2022-09-12 MED ORDER — ONDANSETRON HCL 4 MG/2ML IJ SOLN: 4.0000 mg | Freq: Four times a day (QID) | INTRAMUSCULAR | Status: DC | PRN

## 2022-09-12 MED ORDER — SODIUM CHLORIDE 0.9 % IV BOLUS
500.0000 mL | Freq: Once | INTRAVENOUS | Status: AC
  Administered 2022-09-12: 500 mL via INTRAVENOUS

## 2022-09-12 MED ORDER — ACETAMINOPHEN 650 MG RE SUPP
650.0000 mg | Freq: Four times a day (QID) | RECTAL | Status: DC | PRN
Start: 2022-09-12 — End: 2022-09-12

## 2022-09-12 NOTE — ED Notes (Signed)
Pt aware of the need for a urine.Marland KitchenMarland KitchenUnable to currently collect a sample.Marland KitchenMarland Kitchen

## 2022-09-12 NOTE — Progress Notes (Addendum)
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  Headache was quite significant. In the ED routine blood work showed symptomatic microcytic anemia with hemoglobin of 6.9, baseline 9.0 and MCV of 68.  Headache was not relieved after IV fluid, ketorolac and Reglan.  Chest x-ray shows some opacity but patient remains on room air.  BNP is normal but patient is morbidly obese.  Per ED provider there is no obvious signs of blood loss and patient has declined heavy menstruation.  ED provider requesting patient to be admitted as they are unable to transfuse patient at [facility].  I requested ED provider to obtain iron studies, ferritin, B12, folate, TSH.  Also requested CT head for severe headaches.  If CT head is negative, patient will be admitted to [facility] but if significantly abnormal and depending on the abnormality, she may require admission to [facility].  Will await CT head results prior to placing admission orders.  EDP to reach back out with the results.   ADDENDUM 655PM CT head is neg, will admit patient to [facility] under observation.   Stephania Fragmin MD [facility]

## 2022-09-12 NOTE — ED Triage Notes (Signed)
Patient arrives with complaints of dizziness, headache, and nausea x1 day. Rates pain a 7/10. Concerned about fatigue from heat at work.

## 2022-09-12 NOTE — H&P (Signed)
History and Physical    Nicole Hurley ZOX:[id number] DOB: 20-Jun-1985 DOA: 09/12/2022  PCP: [facility]  Patient coming from: Home  I have personally briefly reviewed patient's old medical records in [facility]  Chief Complaint: Headache, nausea, fatigue  HPI: Nicole Hurley is a 37 y.o. female with medical history significant for HTN, prediabetes, microcytic anemia who presented to the ED for evaluation of headache, nausea, fatigue.  Patient states she was outside picking up garbage earlier today and that he when she developed nausea, fatigue, diaphoresis, and headache.  Patient states she does get headaches anytime she is stressed and usually resolve on their own.  This time headache persisted.  She localizes in the posterior part of her head with throbbing sensation.  She has had some relief with initial meds given in the ED.  She has felt fatigued, lightheaded, and somewhat short of breath when walking.  Patient does state that she generally has regular menses however the last 1 was heavier than usual.  She has not seen any other obvious bleeding including melena, hematochezia, hemoptysis, hematemesis, or hematuria.  She had been avoiding regular medical care for some period of time now but just recently established with [facility] medical primary care within the last month.  She says she is not taking any medications regularly other than occasional ibuprofen/Advil for pain.  She does report smoking about 4 cigarettes a day.  [facility] ED Course  Labs/Imaging on admission: I have personally reviewed following labs and imaging studies.  Initial vitals showed BP 149/71, pulse 111, RR 21, temp 98.3 F, SpO2 96% on room air.  Labs show hemoglobin 6.9, hematocrit 26.3, MCV 68.0, platelets 353,000, WBC 10.6, sodium 136, potassium 3.5, bicarb 27, BUN 7, creatinine 0.48, serum glucose 146, BNP 14.8, FOBT negative.  Ferritin 4.  Iron/TIBC, B12, folate in  process.  TSH 0.798.  Portable chest x-ray showed enlarged cardiac silhouette with pulmonary vascular prominence and diffuse bilateral interstitial pulmonary opacity.  CT head without contrast negative for acute intracranial findings.  Patient was given 500 cc normal saline, IV Toradol 15 mg, IV morphine 4 mg, IV Reglan 10 mg.  The hospitalist service was consulted to admit for management of symptomatic anemia as blood transfusion available at [facility] ED.  Review of Systems: All systems reviewed and are negative except as documented in history of present illness above.   Past Medical History:  Diagnosis Date   Gallstones 09/2017   Hypertension     Past Surgical History:  Procedure Laterality Date   CESAREAN SECTION     CHOLECYSTECTOMY N/A 10/22/2017   Procedure: LAPAROSCOPIC CHOLECYSTECTOMY WITH POSSIBLE INTRAOPERATIVE CHOLANGIOGRAM;  Surgeon: Harriette Bouillon, MD;  Location: [facility] OR;  Service: General;  Laterality: N/A;   DENTAL SURGERY      Social History:  reports that she has quit smoking. Her smoking use included cigarettes. She has never used smokeless tobacco. She reports current alcohol use. She reports that she does not use drugs.  Allergies  Allergen Reactions   Benadryl [Diphenhydramine Hcl (Sleep)] Hives    Family History  Problem Relation Age of Onset   Healthy Mother    Healthy Father      Prior to Admission medications   Medication Sig Start Date End Date Taking? Authorizing Provider  acetaminophen (TYLENOL) 500 MG tablet Take 2 tablets (1,000 mg total) by mouth every 8 (eight) hours as needed for mild pain. 10/23/17   Focht, Joyce Copa, PA  albuterol (VENTOLIN HFA) 108 (  90 Base) MCG/ACT inhaler Inhale 1-2 puffs into the lungs every 6 (six) hours as needed for wheezing or shortness of breath. 04/03/20   Particia Nearing, PA-C  amLODipine (NORVASC) 5 MG tablet Take 5 mg by mouth daily.     [provider]  calcium carbonate (TUMS) 500 MG chewable  tablet Chew 500 mg by mouth daily as needed for indigestion or heartburn.     [provider]  metFORMIN (GLUCOPHAGE) 1000 MG tablet Take 1,000 mg by mouth 2 (two) times daily with a meal.    [provider]  ondansetron (ZOFRAN) 4 MG tablet Take 1 tablet (4 mg total) by mouth every 6 (six) hours as needed for nausea or vomiting. 11/29/21   Rising, Lurena Joiner, PA-C  Simethicone (GAS RELIEF PO) Take 1 tablet by mouth daily as needed (gas).    [provider]    Physical Exam: Vitals:   09/12/22 1408 09/12/22 1530 09/12/22 1800 09/12/22 2000  BP: (!) 149/71 (!) 150/84 120/63 126/81  Pulse: (!) 111 97 94 93  Resp: (!) 21 16 16  (!) 21  Temp: 98.3 F (36.8 C)     TempSrc: Oral     SpO2: 96% 96% 95% 97%  Weight:      Height:       Constitutional: Morbidly obese woman resting in bed with head elevated Eyes: EOMI, lids and conjunctivae normal ENMT: Mucous membranes are moist. Posterior pharynx clear of any exudate or lesions.Normal dentition.  Neck: normal, supple, no masses. Respiratory: clear to auscultation bilaterally, no wheezing, no crackles. Normal respiratory effort. No accessory muscle use.  Cardiovascular: Regular rate and rhythm, no murmurs / rubs / gallops. No extremity edema. 2+ pedal pulses. Abdomen: no tenderness, no masses palpated.  Musculoskeletal: no clubbing / cyanosis. No joint deformity upper and lower extremities. Good ROM, no contractures. Normal muscle tone.  Skin: no rashes, lesions, ulcers. No induration Neurologic: Sensation intact. Strength 5/5 in all 4.  Psychiatric: Alert and oriented x 3.  Somewhat anxious mood.   EKG: Personally reviewed. Sinus tachycardia, rate 107, QTc 486, no acute ischemic changes.  Similar to prior.  Assessment/Plan Principal Problem:   Symptomatic anemia   Nicole Hurley is a 37 y.o. female with medical history significant for HTN, prediabetes, microcytic anemia who is admitted with symptomatic  anemia.  Assessment and Plan: Symptomatic iron deficiency anemia: Acute on chronic anemia with iron deficiency likely in setting of heavy menses.  Hemoglobin 6.9, ferritin is 4.  FOBT is negative. -Transfuse 1 unit PRBC -Follow iron/TIBC, B12, folate levels -Repeat CBC posttransfusion  Hypertension: BP stable on admission.  She is not on any medications as an outpatient.  Cardiomegaly: Chronic finding on CXR.  Also noted to have interstitial changes, also appears chronic as similar finding seen on remote chest films.  Recommend outpatient monitoring with PCP.  Morbid obesity: Body mass index is 67.79 kg/m.    DVT prophylaxis: SCDs Start: 09/12/22 2137 Code Status: Full code Family Communication: Mother, brother, sister, 2 children all at bedside on admission Disposition Plan: From home and likely discharge to home in 1-2 days Consults called: None Severity of Illness: The appropriate patient status for this patient is OBSERVATION. Observation status is judged to be reasonable and necessary in order to provide the required intensity of service to ensure the patient's safety. The patient's presenting symptoms, physical exam findings, and initial radiographic and laboratory data in the context of their medical condition is felt to place them at decreased  risk for further clinical deterioration. Furthermore, it is anticipated that the patient will be medically stable for discharge from the hospital within 2 midnights of admission.   Darreld Mclean MD [facility]  If 7PM-7AM, please contact night-coverage [email or web address]  09/12/2022, 9:50 PM

## 2022-09-12 NOTE — ED Notes (Signed)
Informed by lab that the type and screen is a send out to [facility] and if they are being admitted they will perform the same test again upon their arrival... Provider informed and stated to hold the test until she is admitted to the other location.Marland KitchenMarland Kitchen

## 2022-09-12 NOTE — ED Provider Notes (Signed)
[place] EMERGENCY DEPARTMENT AT [facility] Provider Note   CSN: [id number] Arrival date & time: 09/12/22  1357     History  Chief Complaint  Patient presents with   Dizziness   Nausea    Nicole Hurley is a 37 y.o. female.  With history of hypertension who presents to the ED for evaluation of a headache.  She states she was working outside picking up garbage approximately 2 hours prior to arrival when she noticed some nausea and fatigue as well as feeling hot and flushed.  She states she has slowly developed a headache since that time.  Localizes the headache to the posterior half of the head.  It is described as an aching sensation.  States she gets headaches anytime she gets stressed.  She reports a significant amount of stress from her work lately.  She has not been drinking much water.  She denies any vomiting.  No dizziness or lightheadedness.  No vision changes.  No numbness, weakness or tingling.  No neck pain or stiffness.  No chest pain or shortness of breath.  She still has some nausea and a headache but the remainder of her symptoms have resolved.   Dizziness Associated symptoms: headaches        Home Medications Prior to Admission medications   Medication Sig Start Date End Date Taking? Authorizing Provider  acetaminophen (TYLENOL) 500 MG tablet Take 2 tablets (1,000 mg total) by mouth every 8 (eight) hours as needed for mild pain. 10/23/17   Focht, Joyce Copa, PA  albuterol (VENTOLIN HFA) 108 (90 Base) MCG/ACT inhaler Inhale 1-2 puffs into the lungs every 6 (six) hours as needed for wheezing or shortness of breath. 04/03/20   Particia Nearing, PA-C  amLODipine (NORVASC) 5 MG tablet Take 5 mg by mouth daily.     [provider]  calcium carbonate (TUMS) 500 MG chewable tablet Chew 500 mg by mouth daily as needed for indigestion or heartburn.     [provider]  metFORMIN (GLUCOPHAGE) 1000 MG tablet Take 1,000 mg by mouth 2 (two) times  daily with a meal.    [provider]  ondansetron (ZOFRAN) 4 MG tablet Take 1 tablet (4 mg total) by mouth every 6 (six) hours as needed for nausea or vomiting. 11/29/21   Rising, Lurena Joiner, PA-C  Simethicone (GAS RELIEF PO) Take 1 tablet by mouth daily as needed (gas).    [provider]      Allergies    Benadryl [diphenhydramine hcl (sleep)]    Review of Systems   Review of Systems  Neurological:  Positive for headaches.  All other systems reviewed and are negative.   Physical Exam Updated Vital Signs BP 120/63 (BP Location: Left Arm)   Pulse 94   Temp 98.3 F (36.8 C) (Oral)   Resp 16   Ht 5\' 6"  (1.676 m)   Wt (!) 190.5 kg   LMP 09/05/2022   SpO2 95%   BMI 67.79 kg/m  Physical Exam Vitals and nursing note reviewed.  Constitutional:      General: She is not in acute distress.    Appearance: She is well-developed. She is obese. She is not ill-appearing, toxic-appearing or diaphoretic.  HENT:     Head: Normocephalic and atraumatic.  Eyes:     Extraocular Movements: Extraocular movements intact.     Conjunctiva/sclera: Conjunctivae normal.     Pupils: Pupils are equal, round, and reactive to light.  Cardiovascular:     Rate  and Rhythm: Normal rate and regular rhythm.     Heart sounds: No murmur heard. Pulmonary:     Effort: Pulmonary effort is normal. No respiratory distress.     Breath sounds: Normal breath sounds.  Abdominal:     Palpations: Abdomen is soft.     Tenderness: There is no abdominal tenderness.  Musculoskeletal:        General: No swelling.     Cervical back: Neck supple.  Skin:    General: Skin is warm and dry.     Capillary Refill: Capillary refill takes less than 2 seconds.  Neurological:     General: No focal deficit present.     Mental Status: She is alert and oriented to person, place, and time.     Comments:   MENTAL STATUS: AAOx3   LANG/SPEECH: Fluent, intact naming, repetition & comprehension   CRANIAL NERVES:   II:  Pupils equal and reactive   III, IV, VI: EOM intact, no gaze preference or deviation, no nystagmus   V: normal sensation of the face   VII: no facial asymmetry   VIII: normal hearing to speech   MOTOR: 5/5 in both upper and lower extremities   SENSORY: Normal to touch in all extremiteis   COORD: Normal finger to nose, heel to shin and shoulder shrug, no tremor, no dysmetria. No pronator drift   Psychiatric:        Mood and Affect: Mood normal.     ED Results / Procedures / Treatments   Labs (all labs ordered are listed, but only abnormal results are displayed) Labs Reviewed  BASIC METABOLIC PANEL - Abnormal; Notable for the following components:      Result Value   Glucose, Bld 146 (*)    All other components within normal limits  CBC - Abnormal; Notable for the following components:   WBC 10.6 (*)    Hemoglobin 6.9 (*)    HCT 26.3 (*)    MCV 68.0 (*)    MCH 17.8 (*)    MCHC 26.2 (*)    RDW 24.7 (*)    All other components within normal limits  FERRITIN - Abnormal; Notable for the following components:   Ferritin 4 (*)    All other components within normal limits  RETICULOCYTES - Abnormal; Notable for the following components:   RBC. 3.62 (*)    Immature Retic Fract 29.9 (*)    All other components within normal limits  HEPATIC FUNCTION PANEL - Abnormal; Notable for the following components:   Total Protein 8.3 (*)    Albumin 3.3 (*)    AST 13 (*)    All other components within normal limits  CBG MONITORING, ED - Abnormal; Notable for the following components:   Glucose-Capillary 119 (*)    All other components within normal limits  HCG, QUANTITATIVE, PREGNANCY  BRAIN NATRIURETIC PEPTIDE  OCCULT BLOOD X 1 CARD TO LAB, STOOL  TSH  URINALYSIS, ROUTINE W REFLEX MICROSCOPIC  VITAMIN B12  FOLATE  IRON AND TIBC  POC OCCULT BLOOD, ED  TYPE AND SCREEN  PREPARE RBC (CROSSMATCH)    EKG EKG Interpretation  Date/Time:  Friday September 12 2022 14:07:12 EDT Ventricular  Rate:  107 PR Interval:  160 QRS Duration: 94 QT Interval:  364 QTC Calculation: 486 R Axis:   85 Text Interpretation: Sinus tachycardia Borderline prolonged QT interval increased rate from prior 10/20 Confirmed by Meridee Score [phone]) on 09/12/2022 3:26:58 PM  Radiology CT Head Wo Contrast  Result  Date: 09/12/2022 CLINICAL DATA:  Headaches EXAM: CT HEAD WITHOUT CONTRAST TECHNIQUE: Contiguous axial images were obtained from the base of the skull through the vertex without intravenous contrast. RADIATION DOSE REDUCTION: This exam was performed according to the departmental dose-optimization program which includes automated exposure control, adjustment of the mA and/or kV according to patient size and/or use of iterative reconstruction technique. COMPARISON:  None Available. FINDINGS: Brain: No acute intracranial findings are seen. There are no signs of bleeding within the cranium. Ventricles are not dilated. There is no focal edema or mass effect. Vascular: Unremarkable. Skull: No acute findings are seen. Sinuses/Orbits: Unremarkable. Other: None. IMPRESSION: No acute intracranial findings are seen in noncontrast CT brain. Electronically Signed   By: Ernie Avena M.D.   On: 09/12/2022 18:52   DG Chest Port 1 View  Result Date: 09/12/2022 CLINICAL DATA:  Anemia, tachypnea, dizziness, headache, nausea EXAM: PORTABLE CHEST 1 VIEW COMPARISON:  01/31/2019 FINDINGS: Cardiomegaly. Pulmonary vascular prominence and diffuse bilateral interstitial pulmonary opacity. The visualized skeletal structures are unremarkable. IMPRESSION: Cardiomegaly with pulmonary vascular prominence and diffuse bilateral interstitial pulmonary opacity, likely edema. No focal airspace opacity. Electronically Signed   By: Jearld Lesch M.D.   On: 09/12/2022 15:20    Procedures .Critical Care  Performed by: Michelle Piper, PA-C Authorized by: Michelle Piper, PA-C   Critical care provider statement:    Critical  care time (minutes):  30   Critical care was necessary to treat or prevent imminent or life-threatening deterioration of the following conditions:  Circulatory failure   Critical care was time spent personally by me on the following activities:  Development of treatment plan with patient or surrogate, discussions with consultants, evaluation of patient's response to treatment, examination of patient, ordering and review of laboratory studies, ordering and review of radiographic studies, ordering and performing treatments and interventions, pulse oximetry, re-evaluation of patient's condition and review of old charts   Care discussed with: admitting provider   Comments:     Symptomatic anemia, hemoglobin 6.9.     Medications Ordered in ED Medications  0.9 %  sodium chloride infusion (Manually program via Guardrails IV Fluids) (has no administration in time range)  morphine (PF) 4 MG/ML injection 4 mg (has no administration in time range)  metoCLOPramide (REGLAN) injection 10 mg (10 mg Intravenous Given 09/12/22 1529)  ketorolac (TORADOL) 30 MG/ML injection 15 mg (15 mg Intravenous Given 09/12/22 1526)  sodium chloride 0.9 % bolus 500 mL (0 mLs Intravenous Stopped 09/12/22 1737)    ED Course/ Medical Decision Making/ A&P Clinical Course as of 09/12/22 1931  Fri Sep 12, 2022  1730 Spoke with hospitalist Dr. Nelson Chimes who will admit if CT head is normal [AS]    Clinical Course User Index [AS] Lula Olszewski Edsel Petrin, PA-C                             Medical Decision Making Amount and/or Complexity of Data Reviewed Labs: ordered. Radiology: ordered.  Risk Prescription drug management. Decision regarding hospitalization.  This patient presents to the ED for concern of headache, this involves an extensive number of treatment options, and is a complaint that carries with it a high risk of complications and morbidity. Emergent considerations for headache include subarachnoid hemorrhage, meningitis,  temporal arteritis, glaucoma, cerebral ischemia, carotid/vertebral dissection, intracranial tumor, Venous sinus thrombosis, carbon monoxide poisoning, acute or chronic subdural hemorrhage.  Other considerations include: Migraine, Cluster headache, Hypertension, Caffeine, alcohol, or  drug withdrawal, Pseudotumor cerebri, Arteriovenous malformation, Head injury, Neurocysticercosis, Post-lumbar puncture, Preeclampsia, Tension headache, Sinusitis, Cervical arthritis, Refractive error causing strain, Dental abscess, Otitis media, Temporomandibular joint syndrome, Depression, Somatoform disorder (eg, somatization) Trigeminal neuralgia, Glossopharyngeal neuralgia.   Co morbidities that complicate the patient evaluation  hypertension  My initial workup includes labs, symptom control  Additional history obtained from: Nursing notes from this visit. Family daughter is at bedside and provides a portion of the history  I ordered, reviewed and interpreted labs which include: BMP, CBC, hCG, urinalysis.  Hemoglobin of 6.9, microcytic hypochromic.  Hyperglycemia of 146.  Spoke with hospitalist Dr. Nelson Chimes who will admit.  Afebrile, hemodynamically stable.  37 year old female presenting to the ED for evaluation of headache, fatigue, nausea.  This occurred while she was outside performing activity.  On physical exam she is obese so it is difficult to assess possibility of fluid overload.  She has normal lung sounds.  She is nontachypneic and nonhypoxic.  She reported some improvement in her symptoms after fluid bolus, Toradol and Reglan but still had a moderate headache.  CT negative.  I believe her headache is likely secondary to her anemia.  Unfortunately do not have blood transfusion capabilities at this facility.  Believe she would benefit from admission to the hospital service for blood transfusion.  She does have a history of anemia but her last hemoglobin was 3 years ago.  It is typically between 9 and 10.  She  denies any new bleeding including rectal bleeding or heavy menstrual cycles.  She will be admitted to the hospital service for further management.  Anemia panel and transfusion ordered.  Stable at the time of admission.  Note: Portions of this report may have been transcribed using voice recognition software. Every effort was made to ensure accuracy; however, inadvertent computerized transcription errors may still be present.        Final Clinical Impression(s) / ED Diagnoses Final diagnoses:  Symptomatic anemia  Bad headache    Rx / DC Orders ED Discharge Orders     None         Mora Bellman 09/12/22 1931    Blane Ohara, MD 09/13/22 [phone]

## 2022-09-12 NOTE — ED Notes (Signed)
Report given to the floor. 

## 2022-09-12 NOTE — Hospital Course (Signed)
Nicole Hurley is a 37 y.o. female with medical history significant for HTN, prediabetes, microcytic anemia who is admitted with symptomatic anemia.

## 2022-09-12 NOTE — ED Notes (Signed)
Report given to [facility]. 

## 2022-09-13 ENCOUNTER — Observation Stay (HOSPITAL_BASED_OUTPATIENT_CLINIC_OR_DEPARTMENT_OTHER): Payer: Medicaid Other

## 2022-09-13 DIAGNOSIS — D649 Anemia, unspecified: Secondary | ICD-10-CM

## 2022-09-13 DIAGNOSIS — I5031 Acute diastolic (congestive) heart failure: Secondary | ICD-10-CM

## 2022-09-13 DIAGNOSIS — Z79899 Other long term (current) drug therapy: Secondary | ICD-10-CM | POA: Diagnosis not present

## 2022-09-13 DIAGNOSIS — I10 Essential (primary) hypertension: Secondary | ICD-10-CM | POA: Diagnosis not present

## 2022-09-13 DIAGNOSIS — Z87891 Personal history of nicotine dependence: Secondary | ICD-10-CM | POA: Diagnosis not present

## 2022-09-13 LAB — CBC
HCT: 27.2 % — ABNORMAL LOW (ref 36.0–46.0)
HCT: 27.7 % — ABNORMAL LOW (ref 36.0–46.0)
Hemoglobin: 6.9 g/dL — CL (ref 12.0–15.0)
Hemoglobin: 7.4 g/dL — ABNORMAL LOW (ref 12.0–15.0)
MCH: 18.2 pg — ABNORMAL LOW (ref 26.0–34.0)
MCH: 18.9 pg — ABNORMAL LOW (ref 26.0–34.0)
MCHC: 25.4 g/dL — ABNORMAL LOW (ref 30.0–36.0)
MCHC: 26.7 g/dL — ABNORMAL LOW (ref 30.0–36.0)
MCV: 71.6 fL — ABNORMAL LOW (ref 80.0–100.0)
MCV: 70.8 fL — ABNORMAL LOW (ref 80.0–100.0)
Platelets: 437 10*3/uL — ABNORMAL HIGH (ref 150–400)
Platelets: 377 10*3/uL (ref 150–400)
RBC: 3.8 MIL/uL — ABNORMAL LOW (ref 3.87–5.11)
RBC: 3.91 MIL/uL (ref 3.87–5.11)
RDW: 25 % — ABNORMAL HIGH (ref 11.5–15.5)
RDW: 25.7 % — ABNORMAL HIGH (ref 11.5–15.5)
WBC: 10 10*3/uL (ref 4.0–10.5)
WBC: 9.2 10*3/uL (ref 4.0–10.5)
nRBC: 0.3 % — ABNORMAL HIGH (ref 0.0–0.2)
nRBC: 0 % (ref 0.0–0.2)

## 2022-09-13 LAB — BASIC METABOLIC PANEL
Anion gap: 7 (ref 5–15)
BUN: 9 mg/dL (ref 6–20)
CO2: 26 mmol/L (ref 22–32)
Calcium: 8.3 mg/dL — ABNORMAL LOW (ref 8.9–10.3)
Chloride: 102 mmol/L (ref 98–111)
Creatinine, Ser: 0.54 mg/dL (ref 0.44–1.00)
GFR, Estimated: >60 mL/min (ref >60)
Glucose, Bld: 87 mg/dL (ref 70–99)
Potassium: 3.6 mmol/L (ref 3.5–5.1)
Sodium: 135 mmol/L (ref 135–145)

## 2022-09-13 LAB — ECHOCARDIOGRAM COMPLETE
AR max vel: 3.9 cm2
AV Area VTI: 3.45 cm2
AV Area mean vel: 3.6 cm2
AV Mean grad: 7 mmHg
AV Peak grad: 12.3 mmHg
Ao pk vel: 1.75 m/s
Area-P 1/2: 3.08 cm2
Calc EF: 64.2 %
Height: 66 in
MV VTI: 3.41 cm2
S' Lateral: 4.1 cm
Single Plane A2C EF: 66.6 %
Single Plane A4C EF: 63.5 %
Weight: 6720 oz

## 2022-09-13 LAB — ABO/RH: ABO/RH(D): A POS

## 2022-09-13 LAB — HIV ANTIBODY (ROUTINE TESTING W REFLEX): HIV Screen 4th Generation wRfx: NONREACTIVE

## 2022-09-13 LAB — TYPE AND SCREEN

## 2022-09-13 LAB — BPAM RBC: ISSUE DATE / TIME: 202406220433

## 2022-09-13 MED ORDER — FUROSEMIDE 10 MG/ML IJ SOLN
40.0000 mg | Freq: Once | INTRAMUSCULAR | Status: AC
  Administered 2022-09-13: 40 mg via INTRAVENOUS
  Filled 2022-09-13: qty 4

## 2022-09-13 MED ORDER — FERROUS GLUCONATE 324 (38 FE) MG PO TABS: 324.0000 mg | ORAL_TABLET | Freq: Two times a day (BID) | ORAL | 1 refills | Status: DC

## 2022-09-13 MED ORDER — ALBUTEROL SULFATE HFA 108 (90 BASE) MCG/ACT IN AERS: 1.0000 | INHALATION_SPRAY | Freq: Four times a day (QID) | RESPIRATORY_TRACT | 0 refills | Status: AC | PRN

## 2022-09-13 MED ORDER — PERFLUTREN LIPID MICROSPHERE
1.0000 mL | INTRAVENOUS | Status: AC | PRN
  Administered 2022-09-13: 3 mL via INTRAVENOUS

## 2022-09-13 MED ORDER — SODIUM CHLORIDE 0.9 % IV SOLN: 510.0000 mg | Freq: Once | INTRAVENOUS | Status: DC

## 2022-09-13 MED ORDER — SODIUM CHLORIDE 0.9 % IV SOLN
250.0000 mg | Freq: Once | INTRAVENOUS | Status: AC
  Administered 2022-09-13: 250 mg via INTRAVENOUS
  Filled 2022-09-13: qty 20

## 2022-09-13 NOTE — Progress Notes (Signed)
Pt was discharged home today. Instructions were reviewed with patient, and questions were answered. Pt was taken to main entrance via wheelchair by NT.  

## 2022-09-13 NOTE — Discharge Instructions (Signed)
Follow with [facility] in 5-7 days  Please obtain a referral for a sleep study ASAP  Please get a complete blood count and chemistry panel checked by your Primary MD at your next visit, and again as instructed by your Primary MD. Please get your medications reviewed and adjusted by your Primary MD.  Please request your Primary MD to go over all Hospital Tests and Procedure/Radiological results at the follow up, please get all Hospital records sent to your Prim MD by signing hospital release before you go home.  In some cases, there will be blood work, cultures and biopsy results pending at the time of your discharge. Please request that your primary care M.D. goes through all the records of your hospital data and follows up on these results.  If you had Pneumonia of Lung problems at the Hospital: Please get a 2 view Chest X ray done in 6-8 weeks after hospital discharge or sooner if instructed by your Primary MD.  If you have Congestive Heart Failure: Please call your Cardiologist or Primary MD anytime you have any of the following symptoms:  1) 3 pound weight gain in 24 hours or 5 pounds in 1 week  2) shortness of breath, with or without a dry hacking cough  3) swelling in the hands, feet or stomach  4) if you have to sleep on extra pillows at night in order to breathe  Follow cardiac low salt diet and 1.5 lit/day fluid restriction.  If you have diabetes Accuchecks 4 times/day, Once in AM empty stomach and then before each meal. Log in all results and show them to your primary doctor at your next visit. If any glucose reading is under 80 or above 300 call your primary MD immediately.  If you have Seizure/Convulsions/Epilepsy: Please do not drive, operate heavy machinery, participate in activities at heights or participate in high speed sports until you have seen by Primary MD or a Neurologist and advised to do so again. Per [employer] statutes, patients with  seizures are not allowed to drive until they have been seizure-free for six months.  Use caution when using heavy equipment or power tools. Avoid working on ladders or at heights. Take showers instead of baths. Ensure the water temperature is not too high on the home water heater. Do not go swimming alone. Do not lock yourself in a room alone (i.e. bathroom). When caring for infants or small children, sit down when holding, feeding, or changing them to minimize risk of injury to the child in the event you have a seizure. Maintain good sleep hygiene. Avoid alcohol.   If you had Gastrointestinal Bleeding: Please ask your Primary MD to check a complete blood count within one week of discharge or at your next visit. Your endoscopic/colonoscopic biopsies that are pending at the time of discharge, will also need to followed by your Primary MD.  Get Medicines reviewed and adjusted. Please take all your medications with you for your next visit with your Primary MD  Please request your Primary MD to go over all hospital tests and procedure/radiological results at the follow up, please ask your Primary MD to get all Hospital records sent to his/her office.  If you experience worsening of your admission symptoms, develop shortness of breath, life threatening emergency, suicidal or homicidal thoughts you must seek medical attention immediately by calling 911 or calling your MD immediately  if symptoms less severe.  You must read complete instructions/literature along with all the  possible adverse reactions/side effects for all the Medicines you take and that have been prescribed to you. Take any new Medicines after you have completely understood and accpet all the possible adverse reactions/side effects.   Do not drive or operate heavy machinery when taking Pain medications.   Do not take more than prescribed Pain, Sleep and Anxiety Medications  Special Instructions: If you have smoked or chewed Tobacco  in  the last 2 yrs please stop smoking, stop any regular Alcohol  and or any Recreational drug use.  Wear Seat belts while driving.  Please note You were cared for by a hospitalist during your hospital stay. If you have any questions about your discharge medications or the care you received while you were in the hospital after you are discharged, you can call the unit and asked to speak with the hospitalist on call if the hospitalist that took care of you is not available. Once you are discharged, your primary care physician will handle any further medical issues. Please note that NO REFILLS for any discharge medications will be authorized once you are discharged, as it is imperative that you return to your primary care physician (or establish a relationship with a primary care physician if you do not have one) for your aftercare needs so that they can reassess your need for medications and monitor your lab values.  You can reach the hospitalist office at phone [phone] or fax [phone]   If you do not have a primary care physician, you can call [phone] for a physician referral.  Activity: As tolerated with Full fall precautions use walker/cane & assistance as needed    Diet: low calorie  Disposition Home

## 2022-09-13 NOTE — Plan of Care (Signed)

## 2022-09-13 NOTE — Discharge Summary (Signed)
Physician Discharge Summary  [name] ZOX:[id number] DOB: 04-Oct-1985 DOA: 09/12/2022  PCP: [facility]  Admit date: 09/12/2022 Discharge date: 09/13/2022  Admitted From: home Disposition:  home  Recommendations for Outpatient Follow-up:  Follow up with PCP in 3-5 days Please obtain BMP/CBC in one week  Home Health: none Equipment/Devices: none  Discharge Condition: stable CODE STATUS: Full code Diet Orders (From admission, onward)     Start     Ordered   09/12/22 2137  Diet Heart Room service appropriate? Yes; Fluid consistency: Thin  Diet effective now       Question Answer Comment  Room service appropriate? Yes   Fluid consistency: Thin      09/12/22 2137            HPI: Per admitting MD, Nicole Hurley is a 37 y.o. female with medical history significant for HTN, prediabetes, microcytic anemia who presented to the ED for evaluation of headache, nausea, fatigue. Patient states she was outside picking up garbage earlier today and that he when she developed nausea, fatigue, diaphoresis, and headache.  Patient states she does get headaches anytime she is stressed and usually resolve on their own.  This time headache persisted.  She localizes in the posterior part of her head with throbbing sensation.  She has had some relief with initial meds given in the ED.  She has felt fatigued, lightheaded, and somewhat short of breath when walking. Patient does state that she generally has regular menses however the last 1 was heavier than usual.  She has not seen any other obvious bleeding including melena, hematochezia, hemoptysis, hematemesis, or hematuria. She had been avoiding regular medical care for some period of time now but just recently established with [facility] medical primary care within the last month.  She says she is not taking any medications regularly other than occasional ibuprofen/Advil for pain.  She does report smoking about 4 cigarettes a  day.  Hospital Course / Discharge diagnoses: Principal Problem:   Symptomatic anemia  Principal problem Symptomatic anemia due to iron deficiency -this is likely in the setting of heavy menses, along with significant iron deficiency.  She was transfused unit of packed red blood cells with adequate improvement in her hemoglobin, and she was given a dose of IV iron and placed on p.o. iron upon discharge.  She improved, will be discharged home in stable condition.  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  Recommend weight loss versus bariatric surgery referral if patient agreeable  Sepsis ruled out   Discharge Instructions   Allergies as of 09/13/2022       Reactions   Benadryl [diphenhydramine Hcl (sleep)] Hives        Medication List     STOP taking these medications    ondansetron 4 MG tablet Commonly known as: ZOFRAN       TAKE these medications    acetaminophen 500 MG tablet Commonly known as: TYLENOL Take 500 mg by mouth every 6 (six) hours as needed for moderate pain. What changed: Another medication with the same name was removed. Continue taking this medication, and follow the directions you see here.   albuterol 108 (90 Base) MCG/ACT inhaler Commonly known as: VENTOLIN HFA Inhale 1-2 puffs into the lungs every 6 (six) hours as needed for wheezing or shortness of breath.   ferrous gluconate 324 MG tablet Commonly known  asLaurey Morale Take 1 tablet (324 mg total) by mouth 2 (two) times daily with a meal.       Consultations: none  Procedures/Studies:  ECHOCARDIOGRAM COMPLETE  Result Date: 09/13/2022    ECHOCARDIOGRAM REPORT   Patient Name:   Nicole Hurley Date of Exam: 09/13/2022 Medical Rec #:  [id number]           Height:       66.0 in Accession #:    [id number]          Weight:       420.0 lb Date of Birth:   1985-11-16           BSA:          2.741 m Patient Age:    36 years            BP:           125/63 mmHg Patient Gender: F                   HR:           93 bpm. Exam Location:  Inpatient Procedure: 2D Echo, Cardiac Doppler, Color Doppler and Intracardiac            Opacification Agent Indications:    CHF - Acute Diastolic  History:        Patient has no prior history of Echocardiogram examinations.                 Signs/Symptoms:Fatigue; Risk Factors:Hypertension and Current                 Smoker.  Sonographer:    Wallie Char Referring Phys: 42 Daylene Katayama [facility]  Sonographer Comments: Technically difficult study due to poor echo windows and patient is obese. Image acquisition challenging due to patient body habitus. IMPRESSIONS  1. Left ventricular ejection fraction, by estimation, is 60 to 65%. The left ventricle has normal function. The left ventricle has no regional wall motion abnormalities. There is moderate concentric left ventricular hypertrophy. Left ventricular diastolic function could not be evaluated.  2. Right ventricular systolic function was not well visualized. The right ventricular size is not well visualized.  3. The mitral valve was not well visualized. No evidence of mitral valve regurgitation. No evidence of mitral stenosis.  4. The aortic valve was not well visualized. Aortic valve regurgitation is not visualized. No aortic stenosis is present.  5. The inferior vena cava is dilated in size with <50% respiratory variability, suggesting right atrial pressure of 15 mmHg. Comparison(s): No prior Echocardiogram. Conclusion(s)/Recommendation(s): Very limited images without echo contrast--valves not well seen but can be assessed by Doppler. Normal wall motion with contrast. FINDINGS  Left Ventricle: Left ventricular ejection fraction, by estimation, is 60 to 65%. The left ventricle has normal function. The left ventricle has no regional wall motion abnormalities. Definity contrast agent was  given IV to delineate the left ventricular  endocardial borders. The left ventricular internal cavity size was normal in size. There is moderate concentric left ventricular hypertrophy. Left ventricular diastolic function could not be evaluated due to nondiagnostic images. Left ventricular diastolic function could not be evaluated. Right Ventricle: The right ventricular size is not well visualized. Right vetricular wall thickness was not well visualized. Right ventricular systolic function was not well visualized. Left Atrium: Left atrial size was not well visualized. Right Atrium: Right atrial size was not well visualized. Pericardium: There is no evidence of pericardial effusion. Mitral Valve:  The mitral valve was not well visualized. No evidence of mitral valve regurgitation. No evidence of mitral valve stenosis. MV peak gradient, 7.3 mmHg. The mean mitral valve gradient is 5.0 mmHg. Tricuspid Valve: The tricuspid valve is not well visualized. Tricuspid valve regurgitation is not demonstrated. No evidence of tricuspid stenosis. Aortic Valve: The aortic valve was not well visualized. Aortic valve regurgitation is not visualized. No aortic stenosis is present. Aortic valve mean gradient measures 7.0 mmHg. Aortic valve peak gradient measures 12.2 mmHg. Aortic valve area, by VTI measures 3.45 cm. Pulmonic Valve: The pulmonic valve was not well visualized. Pulmonic valve regurgitation is not visualized. No evidence of pulmonic stenosis. Aorta: The aortic root was not well visualized and the ascending aorta was not well visualized. Venous: The inferior vena cava is dilated in size with less than 50% respiratory variability, suggesting right atrial pressure of 15 mmHg. IAS/Shunts: The interatrial septum was not well visualized.  LEFT VENTRICLE PLAX 2D LVIDd:         6.20 cm      Diastology LVIDs:         4.10 cm      LV e' medial:    9.34 cm/s LV PW:         1.70 cm      LV E/e' medial:  11.3 LV IVS:        1.70 cm       LV e' lateral:   10.70 cm/s LVOT diam:     2.40 cm      LV E/e' lateral: 9.9 LV SV:         116 LV SV Index:   42 LVOT Area:     4.52 cm  LV Volumes (MOD) LV vol d, MOD A2C: 183.0 ml LV vol d, MOD A4C: 242.0 ml LV vol s, MOD A2C: 61.1 ml LV vol s, MOD A4C: 88.3 ml LV SV MOD A2C:     121.9 ml LV SV MOD A4C:     242.0 ml LV SV MOD BP:      136.0 ml RIGHT VENTRICLE         IVC TAPSE (M-mode): 4.4 cm  IVC diam: 3.50 cm LEFT ATRIUM             Index LA diam:        4.40 cm 1.61 cm/m LA Vol (A2C):   55.2 ml 20.14 ml/m LA Vol (A4C):   70.0 ml 25.54 ml/m LA Biplane Vol: 67.1 ml 24.48 ml/m  AORTIC VALVE AV Area (Vmax):    3.90 cm AV Area (Vmean):   3.60 cm AV Area (VTI):     3.45 cm AV Vmax:           175.00 cm/s AV Vmean:          121.000 cm/s AV VTI:            0.337 m AV Peak Grad:      12.2 mmHg AV Mean Grad:      7.0 mmHg LVOT Vmax:         151.00 cm/s LVOT Vmean:        96.200 cm/s LVOT VTI:          0.257 m LVOT/AV VTI ratio: 0.76  AORTA Ao Root diam: 3.30 cm Ao Asc diam:  3.20 cm MITRAL VALVE MV Area (PHT): 3.08 cm     SHUNTS MV Area VTI:   3.41 cm     Systemic VTI:  0.26 m  MV Peak grad:  7.3 mmHg     Systemic Diam: 2.40 cm MV Mean grad:  5.0 mmHg MV Vmax:       1.35 m/s MV Vmean:      105.0 cm/s MV Decel Time: 246 msec MV E velocity: 106.00 cm/s MV A velocity: 93.10 cm/s MV E/A ratio:  1.14 Jodelle Red MD Electronically signed by Jodelle Red MD Signature Date/Time: 09/13/2022/3:34:58 PM    Final    CT Head Wo Contrast  Result Date: 09/12/2022 CLINICAL DATA:  Headaches EXAM: CT HEAD WITHOUT CONTRAST TECHNIQUE: Contiguous axial images were obtained from the base of the skull through the vertex without intravenous contrast. RADIATION DOSE REDUCTION: This exam was performed according to the departmental dose-optimization program which includes automated exposure control, adjustment of the mA and/or kV according to patient size and/or use of iterative reconstruction technique.  COMPARISON:  None Available. FINDINGS: Brain: No acute intracranial findings are seen. There are no signs of bleeding within the cranium. Ventricles are not dilated. There is no focal edema or mass effect. Vascular: Unremarkable. Skull: No acute findings are seen. Sinuses/Orbits: Unremarkable. Other: None. IMPRESSION: No acute intracranial findings are seen in noncontrast CT brain. Electronically Signed   By: Ernie Avena M.D.   On: 09/12/2022 18:52   DG Chest Port 1 View  Result Date: 09/12/2022 CLINICAL DATA:  Anemia, tachypnea, dizziness, headache, nausea EXAM: PORTABLE CHEST 1 VIEW COMPARISON:  01/31/2019 FINDINGS: Cardiomegaly. Pulmonary vascular prominence and diffuse bilateral interstitial pulmonary opacity. The visualized skeletal structures are unremarkable. IMPRESSION: Cardiomegaly with pulmonary vascular prominence and diffuse bilateral interstitial pulmonary opacity, likely edema. No focal airspace opacity. Electronically Signed   By: Jearld Lesch M.D.   On: 09/12/2022 15:20     Subjective: - no chest pain, shortness of breath, no abdominal pain, nausea or vomiting.   Discharge Exam: BP 125/63 (BP Location: Left Arm)   Pulse 95   Temp 97.9 F (36.6 C) (Oral)   Resp 19   Ht 5\' 6"  (1.676 m)   Wt (!) 190.5 kg   LMP 09/05/2022   SpO2 95%   BMI 67.79 kg/m   General: Pt is alert, awake, not in acute distress Cardiovascular: RRR, S1/S2 +, no rubs, no gallops Respiratory: CTA bilaterally, no wheezing, no rhonchi Abdominal: Soft, NT, ND, bowel sounds + Extremities: no edema, no cyanosis   The results of significant diagnostics from this hospitalization (including imaging, microbiology, ancillary and laboratory) are listed below for reference.     Microbiology: No results found for this or any previous visit (from the past 240 hour(s)).   Labs: Basic Metabolic Panel: Recent Labs  Lab 09/12/22 1406 09/12/22 2103  NA 136 135  K 3.5 3.6  CL 101 102  CO2 27 26   GLUCOSE 146* 87  BUN 7 9  CREATININE 0.48 0.54  CALCIUM 8.9 8.3*   Liver Function Tests: Recent Labs  Lab 09/12/22 1729  AST 13*  ALT 11  ALKPHOS 79  BILITOT 0.3  PROT 8.3*  ALBUMIN 3.3*   CBC: Recent Labs  Lab 09/12/22 1406 09/12/22 2103 09/13/22 1431  WBC 10.6* 10.0 9.2  HGB 6.9* 6.9* 7.4*  HCT 26.3* 27.2* 27.7*  MCV 68.0* 71.6* 70.8*  PLT 353 437* 377   CBG: Recent Labs  Lab 09/12/22 1411  GLUCAP 119*   Hgb A1c No results for input(s): "HGBA1C" in the last 72 hours. Lipid Profile No results for input(s): "CHOL", "HDL", "LDLCALC", "TRIG", "CHOLHDL", "LDLDIRECT" in the last 72 hours.  Thyroid function studies Recent Labs    09/12/22 1729  TSH 0.798   Urinalysis    Component Value Date/Time   COLORURINE YELLOW 10/18/2017 2247   APPEARANCEUR CLEAR 10/18/2017 2247   LABSPEC 1.010 10/18/2017 2247   PHURINE 8.0 10/18/2017 2247   GLUCOSEU NEGATIVE 10/18/2017 2247   HGBUR NEGATIVE 10/18/2017 2247   BILIRUBINUR NEGATIVE 10/18/2017 2247   KETONESUR NEGATIVE 10/18/2017 2247   PROTEINUR NEGATIVE 10/18/2017 2247   NITRITE NEGATIVE 10/18/2017 2247   LEUKOCYTESUR NEGATIVE 10/18/2017 2247    FURTHER DISCHARGE INSTRUCTIONS:   Get Medicines reviewed and adjusted: Please take all your medications with you for your next visit with your Primary MD   Laboratory/radiological data: Please request your Primary MD to go over all hospital tests and procedure/radiological results at the follow up, please ask your Primary MD to get all Hospital records sent to his/her office.   In some cases, they will be blood work, cultures and biopsy results pending at the time of your discharge. Please request that your primary care M.D. goes through all the records of your hospital data and follows up on these results.   Also Note the following: If you experience worsening of your admission symptoms, develop shortness of breath, life threatening emergency, suicidal or homicidal  thoughts you must seek medical attention immediately by calling 911 or calling your MD immediately  if symptoms less severe.   You must read complete instructions/literature along with all the possible adverse reactions/side effects for all the Medicines you take and that have been prescribed to you. Take any new Medicines after you have completely understood and accpet all the possible adverse reactions/side effects.    Do not drive when taking Pain medications or sleeping medications (Benzodaizepines)   Do not take more than prescribed Pain, Sleep and Anxiety Medications. It is not advisable to combine anxiety,sleep and pain medications without talking with your primary care practitioner   Special Instructions: If you have smoked or chewed Tobacco  in the last 2 yrs please stop smoking, stop any regular Alcohol  and or any Recreational drug use.   Wear Seat belts while driving.   Please note: You were cared for by a hospitalist during your hospital stay. Once you are discharged, your primary care physician will handle any further medical issues. Please note that NO REFILLS for any discharge medications will be authorized once you are discharged, as it is imperative that you return to your primary care physician (or establish a relationship with a primary care physician if you do not have one) for your post hospital discharge needs so that they can reassess your need for medications and monitor your lab values.  Time coordinating discharge: 35 minutes  SIGNED:  Pamella Pert, MD, PhD 09/13/2022, 5:24 PM

## 2022-09-15 LAB — TYPE AND SCREEN
ABO/RH(D): A POS
Antibody Screen: NEGATIVE
Unit division: 0

## 2022-09-15 LAB — BPAM RBC
Blood Product Expiration Date: 202407142359
Unit Type and Rh: 6200

## 2022-10-02 ENCOUNTER — Other Ambulatory Visit: Payer: Self-pay

## 2022-10-02 ENCOUNTER — Encounter (HOSPITAL_COMMUNITY): Payer: Self-pay | Admitting: Emergency Medicine

## 2022-10-02 ENCOUNTER — Observation Stay (HOSPITAL_COMMUNITY)
Admission: EM | Admit: 2022-10-02 | Discharge: 2022-10-03 | Disposition: A | Discharge status: Home / Self Care | Payer: Medicaid Other | Attending: Family Medicine | Admitting: Family Medicine

## 2022-10-02 ENCOUNTER — Emergency Department (HOSPITAL_COMMUNITY): Payer: Medicaid Other

## 2022-10-02 DIAGNOSIS — E278 Other specified disorders of adrenal gland: Secondary | ICD-10-CM

## 2022-10-02 DIAGNOSIS — D500 Iron deficiency anemia secondary to blood loss (chronic): Secondary | ICD-10-CM | POA: Diagnosis not present

## 2022-10-02 DIAGNOSIS — Z79899 Other long term (current) drug therapy: Secondary | ICD-10-CM | POA: Insufficient documentation

## 2022-10-02 DIAGNOSIS — R079 Chest pain, unspecified: Secondary | ICD-10-CM

## 2022-10-02 DIAGNOSIS — Z87891 Personal history of nicotine dependence: Secondary | ICD-10-CM | POA: Diagnosis not present

## 2022-10-02 DIAGNOSIS — J9601 Acute respiratory failure with hypoxia: Secondary | ICD-10-CM | POA: Diagnosis not present

## 2022-10-02 DIAGNOSIS — I10 Essential (primary) hypertension: Secondary | ICD-10-CM | POA: Insufficient documentation

## 2022-10-02 DIAGNOSIS — R590 Localized enlarged lymph nodes: Secondary | ICD-10-CM

## 2022-10-02 DIAGNOSIS — R0602 Shortness of breath: Secondary | ICD-10-CM | POA: Diagnosis not present

## 2022-10-02 DIAGNOSIS — R0609 Other forms of dyspnea: Principal | ICD-10-CM

## 2022-10-02 LAB — RETICULOCYTES
Immature Retic Fract: 24.6 % — ABNORMAL HIGH (ref 2.3–15.9)
RBC.: 3.95 MIL/uL (ref 3.87–5.11)
Retic Count, Absolute: 56.1 10*3/uL (ref 19.0–186.0)
Retic Ct Pct: 1.4 % (ref 0.4–3.1)

## 2022-10-02 LAB — BASIC METABOLIC PANEL
Anion gap: 14 (ref 5–15)
BUN: 6 mg/dL (ref 6–20)
CO2: 23 mmol/L (ref 22–32)
Calcium: 8.6 mg/dL — ABNORMAL LOW (ref 8.9–10.3)
Chloride: 102 mmol/L (ref 98–111)
Creatinine, Ser: 0.73 mg/dL (ref 0.44–1.00)
GFR, Estimated: >60 mL/min (ref >60)
Glucose, Bld: 132 mg/dL — ABNORMAL HIGH (ref 70–99)
Potassium: 3.5 mmol/L (ref 3.5–5.1)
Sodium: 139 mmol/L (ref 135–145)

## 2022-10-02 LAB — CBC
HCT: 30.2 % — ABNORMAL LOW (ref 36.0–46.0)
Hemoglobin: 8.1 g/dL — ABNORMAL LOW (ref 12.0–15.0)
MCH: 20 pg — ABNORMAL LOW (ref 26.0–34.0)
MCHC: 26.8 g/dL — ABNORMAL LOW (ref 30.0–36.0)
MCV: 74.8 fL — ABNORMAL LOW (ref 80.0–100.0)
Platelets: 416 10*3/uL — ABNORMAL HIGH (ref 150–400)
RBC: 4.04 MIL/uL (ref 3.87–5.11)
RDW: 27 % — ABNORMAL HIGH (ref 11.5–15.5)
WBC: 8.6 10*3/uL (ref 4.0–10.5)
nRBC: 0 % (ref 0.0–0.2)

## 2022-10-02 LAB — TROPONIN I (HIGH SENSITIVITY)
Troponin I (High Sensitivity): 5 ng/L (ref <18)
Troponin I (High Sensitivity): 5 ng/L (ref <18)

## 2022-10-02 LAB — HCG, SERUM, QUALITATIVE: Preg, Serum: NEGATIVE

## 2022-10-02 LAB — TYPE AND SCREEN
ABO/RH(D): A POS
Antibody Screen: NEGATIVE

## 2022-10-02 LAB — BRAIN NATRIURETIC PEPTIDE: B Natriuretic Peptide: 22.3 pg/mL (ref 0.0–100.0)

## 2022-10-02 LAB — D-DIMER, QUANTITATIVE: D-Dimer, Quant: 0.27 ug/mL-FEU (ref 0.00–0.50)

## 2022-10-02 MED ORDER — IOHEXOL 350 MG/ML SOLN
75.0000 mL | Freq: Once | INTRAVENOUS | Status: AC | PRN
  Administered 2022-10-02: 75 mL via INTRAVENOUS

## 2022-10-02 MED ORDER — ONDANSETRON HCL 4 MG PO TABS: 4.0000 mg | ORAL_TABLET | Freq: Four times a day (QID) | ORAL | Status: DC | PRN

## 2022-10-02 MED ORDER — HEPARIN SODIUM (PORCINE) 5000 UNIT/ML IJ SOLN
5000.0000 [IU] | Freq: Three times a day (TID) | INTRAMUSCULAR | Status: DC
  Administered 2022-10-03: 5000 [IU] via SUBCUTANEOUS
  Filled 2022-10-02: qty 1

## 2022-10-02 MED ORDER — HYDRALAZINE HCL 25 MG PO TABS: 25.0000 mg | ORAL_TABLET | Freq: Four times a day (QID) | ORAL | Status: DC | PRN

## 2022-10-02 MED ORDER — ACETAMINOPHEN 325 MG PO TABS
650.0000 mg | ORAL_TABLET | Freq: Four times a day (QID) | ORAL | Status: DC | PRN
  Administered 2022-10-03: 650 mg via ORAL
  Filled 2022-10-02: qty 2

## 2022-10-02 MED ORDER — ACETAMINOPHEN 650 MG RE SUPP: 650.0000 mg | Freq: Four times a day (QID) | RECTAL | Status: DC | PRN

## 2022-10-02 MED ORDER — SODIUM CHLORIDE 0.9% FLUSH
3.0000 mL | Freq: Two times a day (BID) | INTRAVENOUS | Status: DC
  Administered 2022-10-03 (×2): 3 mL via INTRAVENOUS

## 2022-10-02 MED ORDER — POLYETHYLENE GLYCOL 3350 17 G PO PACK: 17.0000 g | PACK | Freq: Every day | ORAL | Status: DC | PRN

## 2022-10-02 MED ORDER — ENOXAPARIN SODIUM 40 MG/0.4ML IJ SOSY: 40.0000 mg | PREFILLED_SYRINGE | INTRAMUSCULAR | Status: DC

## 2022-10-02 MED ORDER — ALBUTEROL SULFATE (2.5 MG/3ML) 0.083% IN NEBU: 2.5000 mg | INHALATION_SOLUTION | RESPIRATORY_TRACT | Status: DC | PRN

## 2022-10-02 MED ORDER — ACETAMINOPHEN 500 MG PO TABS
1000.0000 mg | ORAL_TABLET | Freq: Four times a day (QID) | ORAL | Status: DC | PRN
  Administered 2022-10-02: 1000 mg via ORAL
  Filled 2022-10-02: qty 2

## 2022-10-02 MED ORDER — ONDANSETRON HCL 4 MG/2ML IJ SOLN: 4.0000 mg | Freq: Four times a day (QID) | INTRAMUSCULAR | Status: DC | PRN

## 2022-10-02 NOTE — ED Notes (Signed)
Patient ambulated to restroom on O2 monitoring. Patient observed to be short of breath with wheezing. Patient O2 level noted to be 83% upon ambulation to restroom but quickly returned to 94% upon rest.

## 2022-10-02 NOTE — ED Notes (Signed)
ED TO INPATIENT HANDOFF REPORT  ED Nurse Name and Phone #: Johathon Overturf, RN [phone]  S Name/Age/Gender Nicole Hurley 37 y.o. female Room/Bed: 016C/016C  Code Status   Code Status: Prior  Home/SNF/Other Home Patient oriented to: self, place, time, and situation Is this baseline? Yes   Triage Complete: Triage complete  Chief Complaint Acute respiratory failure with hypoxia [facility]) [J96.01]  Triage Note Patient arrives ambulatory by POV states she was admitted about 2 weeks ago for a blood transfusion. States she was having chest pain and shortness of breath along with a headache. States she no longer has a headache but the chest pain and shortness of breath have not improved. Had period two days ago and states it was heavy.    Allergies Allergies  Allergen Reactions   Benadryl [Diphenhydramine Hcl (Sleep)] Hives    Level of Care/Admitting Diagnosis ED Disposition     ED Disposition  Admit   Condition  --   Comment  Hospital Area: [facility] [[id number]]  Level of Care: Telemetry Medical [104]  May place patient in observation at [facility] or [facility] if equivalent level of care is available:: Yes  Covid Evaluation: Asymptomatic - no recent exposure (last 10 days) testing not required  Diagnosis: Acute respiratory failure with hypoxia [facility]) [[id number]]  Admitting Physician: Briscoe Deutscher [[id number]]  Attending Physician: Briscoe Deutscher [[id number]]          B Medical/Surgery History Past Medical History:  Diagnosis Date   Gallstones 09/2017   Hypertension    Past Surgical History:  Procedure Laterality Date   CESAREAN SECTION     CHOLECYSTECTOMY N/A 10/22/2017   Procedure: LAPAROSCOPIC CHOLECYSTECTOMY WITH POSSIBLE INTRAOPERATIVE CHOLANGIOGRAM;  Surgeon: Harriette Bouillon, MD;  Location: [facility] OR;  Service: General;  Laterality: N/A;   DENTAL SURGERY       A IV Location/Drains/Wounds Patient Lines/Drains/Airways Status     Active  Line/Drains/Airways     Name Placement date Placement time Site Days   Peripheral IV 10/02/22 20 G Left Antecubital 10/02/22  1847  Antecubital  less than 1   Incision - 3 Ports Abdomen Umbilicus Right Upper 10/22/17  1524  -- 1806            Intake/Output Last 24 hours No intake or output data in the 24 hours ending 10/02/22 2216  Labs/Imaging Results for orders placed or performed during the hospital encounter of 10/02/22 (from the past 48 hour(s))  Basic metabolic panel     Status: Abnormal   Collection Time: 10/02/22  4:12 PM  Result Value Ref Range   Sodium 139 135 - 145 mmol/L   Potassium 3.5 3.5 - 5.1 mmol/L   Chloride 102 98 - 111 mmol/L   CO2 23 22 - 32 mmol/L   Glucose, Bld 132 (H) 70 - 99 mg/dL    Comment: Glucose reference range applies only to samples taken after fasting for at least 8 hours.   BUN 6 6 - 20 mg/dL   Creatinine, Ser 2.13 0.44 - 1.00 mg/dL   Calcium 8.6 (L) 8.9 - 10.3 mg/dL   GFR, Estimated >08 >65 mL/min    Comment: (NOTE) Calculated using the CKD-EPI Creatinine Equation (2021)    Anion gap 14 5 - 15    Comment: Performed at [facility], [address] [place], [place] [address]  CBC     Status: Abnormal   Collection Time: 10/02/22  4:12 PM  Result Value Ref Range  WBC 8.6 4.0 - 10.5 K/uL   RBC 4.04 3.87 - 5.11 MIL/uL   Hemoglobin 8.1 (L) 12.0 - 15.0 g/dL    Comment: Reticulocyte Hemoglobin testing may be clinically indicated, consider ordering this additional test [id number]    HCT 30.2 (L) 36.0 - 46.0 %   MCV 74.8 (L) 80.0 - 100.0 fL   MCH 20.0 (L) 26.0 - 34.0 pg   MCHC 26.8 (L) 30.0 - 36.0 g/dL   RDW 54.0 (H) 98.1 - 19.1 %   Platelets 416 (H) 150 - 400 K/uL   nRBC 0.0 0.0 - 0.2 %    Comment: Performed at [facility], [address], [place], [place] [address]  Troponin I (High Sensitivity)     Status: None   Collection Time: 10/02/22  4:12 PM  Result Value Ref Range   Troponin I (High Sensitivity) 5 <18 ng/L     Comment: (NOTE) Elevated high sensitivity troponin I (hsTnI) values and significant  changes across serial measurements may suggest ACS but many other  chronic and acute conditions are known to elevate hsTnI results.  Refer to the "Links" section for chest pain algorithms and additional  guidance. Performed at [facility], [address] [place], [place] [address]   hCG, serum, qualitative     Status: None   Collection Time: 10/02/22  4:12 PM  Result Value Ref Range   Preg, Serum NEGATIVE NEGATIVE    Comment:        THE SENSITIVITY OF THIS METHODOLOGY IS >10 mIU/mL. Performed at [facility], [address], [place], [place] [address]   Type and screen [facility]     Status: None   Collection Time: 10/02/22  4:12 PM  Result Value Ref Range   ABO/RH(D) A POS    Antibody Screen NEG    Sample Expiration      10/05/2022,2359 Performed at [facility], [address] [place], [place] [address]   Troponin I (High Sensitivity)     Status: None   Collection Time: 10/02/22  8:01 PM  Result Value Ref Range   Troponin I (High Sensitivity) 5 <18 ng/L    Comment: (NOTE) Elevated high sensitivity troponin I (hsTnI) values and significant  changes across serial measurements may suggest ACS but many other  chronic and acute conditions are known to elevate hsTnI results.  Refer to the "Links" section for chest pain algorithms and additional  guidance. Performed at [facility], [address] [place], [place] [address]   Brain natriuretic peptide     Status: None   Collection Time: 10/02/22  8:01 PM  Result Value Ref Range   B Natriuretic Peptide 22.3 0.0 - 100.0 pg/mL    Comment: Performed at [facility], [address], [place], [place] [address]   CT Angio Chest PE W and/or Wo Contrast  Result Date: 10/02/2022 CLINICAL DATA:  Headache with chest pain and shortness of breath. EXAM: CT ANGIOGRAPHY CHEST WITH CONTRAST TECHNIQUE:  Multidetector CT imaging of the chest was performed using the standard protocol during bolus administration of intravenous contrast. Multiplanar CT image reconstructions and MIPs were obtained to evaluate the vascular anatomy. RADIATION DOSE REDUCTION: This exam was performed according to the departmental dose-optimization program which includes automated exposure control, adjustment of the mA and/or kV according to patient size and/or use of iterative reconstruction technique. CONTRAST:  75mL OMNIPAQUE IOHEXOL 350 MG/ML SOLN COMPARISON:  None Available.  FINDINGS: Cardiovascular: The thoracic aorta is normal in appearance. The pulmonary arteries are markedly limited in evaluation secondary to suboptimal opacification with intravenous contrast. Normal heart size. No pericardial effusion. Mediastinum/Nodes: There is moderate severity bilateral axillary lymphadenopathy. The largest axillary lymph node measures approximately 3.2 cm x 1.5 cm (axial CT image 68, CT series 5). No enlarged mediastinal or hilar lymph nodes. Thyroid gland, trachea, and esophagus demonstrate no significant findings. Lungs/Pleura: Lungs are clear. No pleural effusion or pneumothorax. Upper Abdomen: An 18 mm diameter low-attenuation left adrenal mass is seen (approximately 24.02 Hounsfield units). Musculoskeletal: No chest wall abnormality. No acute or significant osseous findings. Review of the MIP images confirms the above findings. IMPRESSION: 1. Markedly limited evaluation of the pulmonary arteries secondary to suboptimal opacification with intravenous contrast. 2. Moderate severity bilateral axillary lymphadenopathy. 3. 18 mm diameter low-attenuation left adrenal mass, likely consistent with an adrenal adenoma. Correlation with nonemergent adrenal protocol CT is recommended. This recommendation follows ACR consensus guidelines: Management of Incidental Adrenal Masses: A White Paper of the ACR Incidental Findings Committee. J Am Coll Radiol  2017;14:1038-1044. Electronically Signed   By: Aram Candela M.D.   On: 10/02/2022 20:43   DG Chest 2 View  Result Date: 10/02/2022 CLINICAL DATA:  Shortness of breath EXAM: CHEST - 2 VIEW COMPARISON:  Chest x-ray September 12, 2022 FINDINGS: Unchanged cardiomegaly and mediastinal contours. Persistent bilateral diffuse interstitial pulmonary opacities. No pleural effusion or pneumothorax. No acute osseous abnormality. The visualized upper abdomen is unremarkable. IMPRESSION: 1. Bilateral diffuse interstitial pulmonary opacities, likely mild pulmonary edema. 2. Unchanged cardiomegaly. Electronically Signed   By: Jacob Moores M.D.   On: 10/02/2022 16:32    Pending Labs Unresulted Labs (From admission, onward)     Start     Ordered   10/02/22 2207  Reticulocytes  Once,   URGENT        10/02/22 2206   10/02/22 2206  D-dimer, quantitative  Once,   STAT        10/02/22 2205            Vitals/Pain Today's Vitals   10/02/22 1557 10/02/22 2000 10/02/22 2003 10/02/22 2005  BP:  (!) 148/97    Pulse:  87    Resp:  (!) 29    Temp:    98 F (36.7 C)  TempSrc:      SpO2:  98%    Weight: (!) 190.5 kg     Height: 5\' 6"  (1.676 m)     PainSc: 10-Worst pain ever  6      Isolation Precautions No active isolations  Medications Medications  acetaminophen (TYLENOL) tablet 1,000 mg (1,000 mg Oral Given 10/02/22 2114)  iohexol (OMNIPAQUE) 350 MG/ML injection 75 mL (75 mLs Intravenous Contrast Given 10/02/22 2036)    Mobility walks     Focused Assessments    R Recommendations: See Admitting Provider Note  Report given to:   Additional Notes: Patient is A&Ox4, ambulatory but gets very short of breath. Patient became minimally hypoxic on ambulation and is currently on 2L [place] for comfort.

## 2022-10-02 NOTE — H&P (Signed)
History and Physical    Nicole Hurley:[id number] DOB: 1985/11/24 DOA: 10/02/2022  [facility]P: [facility]   Patient coming from: Home   Chief Complaint: SOB, chest pain   HPI: Nicole Hurley is a 37 y.o. female with medical history significant for iron deficiency anemia, BMI 68, and hypertension not currently on any antihypertensives who presents for evaluation of chest pain and exertional dyspnea.  Patient reports that the symptoms began roughly 3 weeks ago.  She presented to the ED shortly after symptom onset, she was found to have hemoglobin of 6.9, and observed in the hospital overnight with blood transfusion.  Patient reports that her chest pain and exertional dyspnea never improved despite improvement in her hemoglobin.  She denies any associated cough, fever, chills, rhinorrhea, sore throat, or wheezing.  ED Course: Upon arrival to the ED, patient is found to be afebrile and saturating well on room air initially tachypneic and desaturating with ambulation.  EKG demonstrates sinus rhythm.  Labs are notable for hemoglobin 8.1, normal renal function, normal troponin x 2, and normal BNP.  CTA chest demonstrates normal heart size, clear lungs, bilateral axillary adenopathy, and left adrenal mass, unfortunately evaluation of pulmonary arteries was markedly limited.  Patient was given acetaminophen and started on supplemental oxygen via nasal cannula.  Review of Systems:  All other systems reviewed and apart from HPI, are negative.  Past Medical History:  Diagnosis Date   Gallstones 09/2017   Hypertension     Past Surgical History:  Procedure Laterality Date   CESAREAN SECTION     CHOLECYSTECTOMY N/A 10/22/2017   Procedure: LAPAROSCOPIC CHOLECYSTECTOMY WITH POSSIBLE INTRAOPERATIVE CHOLANGIOGRAM;  Surgeon: Harriette Bouillon, MD;  Location: [facility] OR;  Service: General;  Laterality: N/A;   DENTAL SURGERY      Social History:   reports that she has quit smoking. Her  smoking use included cigarettes. She has never used smokeless tobacco. She reports current alcohol use. She reports that she does not use drugs.  Allergies  Allergen Reactions   Benadryl [Diphenhydramine Hcl (Sleep)] Hives    Family History  Problem Relation Age of Onset   Healthy Mother    Healthy Father      Prior to Admission medications   Medication Sig Start Date End Date Taking? Authorizing Provider  acetaminophen (TYLENOL) 500 MG tablet Take 500 mg by mouth every 6 (six) hours as needed for moderate pain.   Yes [provider]  albuterol (VENTOLIN HFA) 108 (90 Base) MCG/ACT inhaler Inhale 1-2 puffs into the lungs every 6 (six) hours as needed for wheezing or shortness of breath. 09/13/22  Yes Gherghe, Daylene Katayama, MD  ferrous gluconate (FERGON) 324 MG tablet Take 1 tablet (324 mg total) by mouth 2 (two) times daily with a meal. 09/13/22  Yes Gherghe, Daylene Katayama, MD  meloxicam (MOBIC) 7.5 MG tablet Take 7.5 mg by mouth daily. 09/16/22  Yes [provider]    Physical Exam: Vitals:   10/02/22 1554 10/02/22 1557 10/02/22 2000 10/02/22 2005  BP: (!) 163/82  (!) 148/97   Pulse: 95  87   Resp: (!) 24  (!) 29   Temp: 98.8 F (37.1 C)   98 F (36.7 C)  TempSrc: Oral     SpO2: 95%  98%   Weight:  (!) 190.5 kg    Height:  5\' 6"  (1.676 m)       Constitutional: NAD, no pallor or diaphoresis   Eyes: PERTLA, lids and conjunctivae normal ENMT: Mucous  membranes are moist. Posterior pharynx clear of any exudate or lesions.   Neck: supple, no masses  Respiratory: clear to auscultation bilaterally, no wheezing, no crackles. No accessory muscle use.  Cardiovascular: S1 & S2 heard, regular rate and rhythm. No JVD. Abdomen: No distension, no tenderness, soft. Bowel sounds active.  Musculoskeletal: no clubbing / cyanosis. No joint deformity upper and lower extremities.   Skin: no significant rashes, lesions, ulcers. Warm, dry, well-perfused. Neurologic: CN 2-12 grossly  intact. Moving all extremities. Alert and oriented.  Psychiatric: Calm. Cooperative.    Labs and Imaging on Admission: I have personally reviewed following labs and imaging studies  CBC: Recent Labs  Lab 10/02/22 1612  WBC 8.6  HGB 8.1*  HCT 30.2*  MCV 74.8*  PLT 416*   Basic Metabolic Panel: Recent Labs  Lab 10/02/22 1612  NA 139  K 3.5  CL 102  CO2 23  GLUCOSE 132*  BUN 6  CREATININE 0.73  CALCIUM 8.6*   GFR: Estimated Creatinine Clearance: 171.6 mL/min (by C-G formula based on SCr of 0.73 mg/dL). Liver Function Tests: No results for input(s): "AST", "ALT", "ALKPHOS", "BILITOT", "PROT", "ALBUMIN" in the last 168 hours. No results for input(s): "LIPASE", "AMYLASE" in the last 168 hours. No results for input(s): "AMMONIA" in the last 168 hours. Coagulation Profile: No results for input(s): "INR", "PROTIME" in the last 168 hours. Cardiac Enzymes: No results for input(s): "CKTOTAL", "CKMB", "CKMBINDEX", "TROPONINI" in the last 168 hours. BNP (last 3 results) No results for input(s): "PROBNP" in the last 8760 hours. HbA1C: No results for input(s): "HGBA1C" in the last 72 hours. CBG: No results for input(s): "GLUCAP" in the last 168 hours. Lipid Profile: No results for input(s): "CHOL", "HDL", "LDLCALC", "TRIG", "CHOLHDL", "LDLDIRECT" in the last 72 hours. Thyroid Function Tests: No results for input(s): "TSH", "T4TOTAL", "FREET4", "T3FREE", "THYROIDAB" in the last 72 hours. Anemia Panel: No results for input(s): "VITAMINB12", "FOLATE", "FERRITIN", "TIBC", "IRON", "RETICCTPCT" in the last 72 hours. Urine analysis:    Component Value Date/Time   COLORURINE YELLOW 10/18/2017 2247   APPEARANCEUR CLEAR 10/18/2017 2247   LABSPEC 1.010 10/18/2017 2247   PHURINE 8.0 10/18/2017 2247   GLUCOSEU NEGATIVE 10/18/2017 2247   HGBUR NEGATIVE 10/18/2017 2247   BILIRUBINUR NEGATIVE 10/18/2017 2247   KETONESUR NEGATIVE 10/18/2017 2247   PROTEINUR NEGATIVE 10/18/2017 2247    NITRITE NEGATIVE 10/18/2017 2247   LEUKOCYTESUR NEGATIVE 10/18/2017 2247   Sepsis Labs: @LABRCNTIP (procalcitonin:4,lacticidven:4) )No results found for this or any previous visit (from the past 240 hour(s)).   Radiological Exams on Admission: CT Angio Chest PE W and/or Wo Contrast  Result Date: 10/02/2022 CLINICAL DATA:  Headache with chest pain and shortness of breath. EXAM: CT ANGIOGRAPHY CHEST WITH CONTRAST TECHNIQUE: Multidetector CT imaging of the chest was performed using the standard protocol during bolus administration of intravenous contrast. Multiplanar CT image reconstructions and MIPs were obtained to evaluate the vascular anatomy. RADIATION DOSE REDUCTION: This exam was performed according to the departmental dose-optimization program which includes automated exposure control, adjustment of the mA and/or kV according to patient size and/or use of iterative reconstruction technique. CONTRAST:  75mL OMNIPAQUE IOHEXOL 350 MG/ML SOLN COMPARISON:  None Available. FINDINGS: Cardiovascular: The thoracic aorta is normal in appearance. The pulmonary arteries are markedly limited in evaluation secondary to suboptimal opacification with intravenous contrast. Normal heart size. No pericardial effusion. Mediastinum/Nodes: There is moderate severity bilateral axillary lymphadenopathy. The largest axillary lymph node measures approximately 3.2 cm x 1.5 cm (axial CT image 68, CT  series 5). No enlarged mediastinal or hilar lymph nodes. Thyroid gland, trachea, and esophagus demonstrate no significant findings. Lungs/Pleura: Lungs are clear. No pleural effusion or pneumothorax. Upper Abdomen: An 18 mm diameter low-attenuation left adrenal mass is seen (approximately 24.02 Hounsfield units). Musculoskeletal: No chest wall abnormality. No acute or significant osseous findings. Review of the MIP images confirms the above findings. IMPRESSION: 1. Markedly limited evaluation of the pulmonary arteries secondary to  suboptimal opacification with intravenous contrast. 2. Moderate severity bilateral axillary lymphadenopathy. 3. 18 mm diameter low-attenuation left adrenal mass, likely consistent with an adrenal adenoma. Correlation with nonemergent adrenal protocol CT is recommended. This recommendation follows ACR consensus guidelines: Management of Incidental Adrenal Masses: A White Paper of the ACR Incidental Findings Committee. J Am Coll Radiol 2017;14:1038-1044. Electronically Signed   By: Aram Candela M.D.   On: 10/02/2022 20:43   DG Chest 2 View  Result Date: 10/02/2022 CLINICAL DATA:  Shortness of breath EXAM: CHEST - 2 VIEW COMPARISON:  Chest x-ray September 12, 2022 FINDINGS: Unchanged cardiomegaly and mediastinal contours. Persistent bilateral diffuse interstitial pulmonary opacities. No pleural effusion or pneumothorax. No acute osseous abnormality. The visualized upper abdomen is unremarkable. IMPRESSION: 1. Bilateral diffuse interstitial pulmonary opacities, likely mild pulmonary edema. 2. Unchanged cardiomegaly. Electronically Signed   By: Jacob Moores M.D.   On: 10/02/2022 16:32    EKG: Independently reviewed. Sinus rhythm.   Assessment/Plan   1. Acute respiratory failure with hypoxia  - Presents with persistent DOE despite recently blood transfusion and was noted to have RR in upper 20s at rest and desaturation to low 80s with ambulation  - No acute findings on CTA chest though pulmonary arteries were not well visualized  - No cough, fever, or URI sxs  - Suspect body habitus to be playing a role in her presentation, D-dimer pending, may need repeat PE study, will start incentive spirometry and trial nebs for now, continue supplemental O2 as needed    2. Chest pain  - Present for ~3 wks  - Troponin normal x2 and no explanatory finding on chest CTA though pulmonary arteries were not well visualized  - Ruled out for ACS, D-dimer is pending, may need repeat PE study  3. Iron-deficiency anemia   - Attributed to dysfunctional uterine bleeding  - Hgb appears stable, no active bleeding -   4. Adrenal mass; axillary adenopathy - Left adrenal mass and b/l axillary LAD noted incidentally on CT in ED  - Discussed with patient, outpatient follow-up recommended     DVT prophylaxis: Lovenox  Code Status: Full  Level of Care: Level of care: Telemetry Medical Family Communication: none present  Disposition Plan:  Patient is from: home  Anticipated d/c is to: Home  Anticipated d/c date is: 10/03/22  Patient currently: Pending additional workup, may need home O2 Consults called: None  Admission status: Observation     Briscoe Deutscher, MD [facility]  10/02/2022, 10:51 PM

## 2022-10-02 NOTE — ED Provider Notes (Signed)
[place] EMERGENCY DEPARTMENT AT [facility] Provider Note  MDM   HPI/ROS:  Nicole Hurley is a 37 y.o. female with a medical history as below who presents for evaluation of shortness of breath.  Reports that she was hospitalized proximately 3 weeks ago for similar, and discharged with a diagnosis of iron deficiency anemia.  In the interim she is continue to have shortness of breath and significant dyspnea with exertion.  She states that this all started suddenly just before her last hospitalization.  Does report some bilateral lower extremity swelling.  Denies any fevers, chills, cough.  Does report that she experiences some orthopnea.  Physical exam is notable for: - Morbid obesity - Tachypnea - No oxygen requirement  On my initial evaluation, patient is:  -Vital signs stable. Patient afebrile, hemodynamically stable, and non-toxic appearing. -Additional history obtained from chart review  This patient's current presentation, including their history and physical exam, is most consistent with symptomatic anemia. Differentials include PE, less likely ACS including volume overload.   Workup started with cbc, cmp, EKG, cxr.  CBC shows hemoglobin of 8.1, which is improved from prior.   Interpretations, interventions, and the patient's course of care are documented below.    Clinical Course as of 10/02/22 2319  Thu Oct 02, 2022  1730 CBC(!) Microcytic anemia, improved from prior. [BB]  1730 Basic metabolic panel(!) No significant electrolyte abnormality [BB]  1730 DG Chest 2 View Evidence of pulmonary interstitial edema, ordered BNP, which was negative. [BB]  2030 Patient attempted to ambulate to the restroom, documented desat to 83% and became visibly air hungry during ambulation.  Placed on 2 L nasal cannula on return to the room. [BB]  2100 CT Angio Chest PE W and/or Wo Contrast Proceeded with CTA of the chest for evaluation of PE given patient's size, concern for  unreliability of D-dimer.  No overt evidence of pulmonary embolism, however poor bolus timing.  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 [BB]  2130 Discussed the patient case with hospitalist at bedside, they requested a D-dimer given the equivocal findings of the CTA of the chest.  Additionally, added reticulocyte count for evaluation of microcytic anemia. [BB]    Clinical Course User Index [BB] Fayrene Helper, MD     Disposition:  I discussed the case with hospitalist who graciously agreed to admit the patient to their service for continued care.   Clinical Impression:  1. Dyspnea on exertion   2. Shortness of breath     Rx / DC Orders ED Discharge Orders     None       The plan for this patient was discussed with Dr. Adela Lank, who voiced agreement and who oversaw evaluation and treatment of this patient.   Clinical Complexity A medically appropriate history, review of systems, and physical exam was performed.  My independent interpretations of EKG, labs, and radiology are documented in the ED course above.   If decision rules were used in this patient's evaluation, they are listed below.   Click here for ABCD2, HEART and other calculatorsREFRESH Note before signing   Patient's presentation is most consistent with acute presentation with potential threat to life or bodily function.  Medical Decision Making Amount and/or Complexity of Data Reviewed Labs: ordered. Radiology: ordered.  Risk OTC drugs. Prescription drug management. Decision regarding hospitalization.    HPI/ROS      See MDM section for pertinent HPI  and ROS. A complete ROS was performed with pertinent positives/negatives noted above.   Past Medical History:  Diagnosis Date   Gallstones 09/2017   Hypertension     Past Surgical History:  Procedure Laterality Date   CESAREAN SECTION      CHOLECYSTECTOMY N/A 10/22/2017   Procedure: LAPAROSCOPIC CHOLECYSTECTOMY WITH POSSIBLE INTRAOPERATIVE CHOLANGIOGRAM;  Surgeon: Harriette Bouillon, MD;  Location: [facility] OR;  Service: General;  Laterality: N/A;   DENTAL SURGERY        Physical Exam   Vitals:   10/02/22 1554 10/02/22 1557 10/02/22 2000 10/02/22 2005  BP: (!) 163/82  (!) 148/97   Pulse: 95  87   Resp: (!) 24  (!) 29   Temp: 98.8 F (37.1 C)   98 F (36.7 C)  TempSrc: Oral     SpO2: 95%  98%   Weight:  (!) 190.5 kg    Height:  5\' 6"  (1.676 m)      Physical Exam Vitals and nursing note reviewed.  Constitutional:      General: She is not in acute distress.    Appearance: She is morbidly obese. She is not ill-appearing or toxic-appearing.  HENT:     Head: Normocephalic.  Eyes:     Pupils: Pupils are equal, round, and reactive to light.  Cardiovascular:     Rate and Rhythm: Normal rate and regular rhythm.  Pulmonary:     Effort: Pulmonary effort is normal. Tachypnea present.     Breath sounds: Normal breath sounds. No stridor. No decreased breath sounds, wheezing, rhonchi or rales.  Abdominal:     Palpations: Abdomen is soft.     Tenderness: There is no abdominal tenderness. There is no rebound.  Musculoskeletal:     Right lower leg: No edema.     Left lower leg: No edema.  Skin:    General: Skin is warm.     Capillary Refill: Capillary refill takes less than 2 seconds.     Coloration: Skin is not cyanotic.  Neurological:     General: No focal deficit present.     Mental Status: She is alert and oriented to person, place, and time.      Procedures   If procedures were preformed on this patient, they are listed below:  Procedures   Fayrene Helper, MD Emergency Medicine PGY-2   Please note that this documentation was produced with the assistance of voice-to-text technology and may contain errors.    Fayrene Helper, MD 10/02/22 2320    Melene Plan, DO 10/03/22 1459

## 2022-10-02 NOTE — ED Triage Notes (Signed)
Patient arrives ambulatory by POV states she was admitted about 2 weeks ago for a blood transfusion. States she was having chest pain and shortness of breath along with a headache. States she no longer has a headache but the chest pain and shortness of breath have not improved. Had period two days ago and states it was heavy.

## 2022-10-03 ENCOUNTER — Telehealth: Payer: Self-pay | Admitting: Physician Assistant

## 2022-10-03 ENCOUNTER — Observation Stay (HOSPITAL_COMMUNITY): Payer: Medicaid Other

## 2022-10-03 ENCOUNTER — Other Ambulatory Visit (HOSPITAL_COMMUNITY): Payer: Self-pay

## 2022-10-03 DIAGNOSIS — J9601 Acute respiratory failure with hypoxia: Secondary | ICD-10-CM | POA: Diagnosis not present

## 2022-10-03 LAB — BASIC METABOLIC PANEL
Anion gap: 8 (ref 5–15)
BUN: 6 mg/dL (ref 6–20)
CO2: 26 mmol/L (ref 22–32)
Calcium: 8.3 mg/dL — ABNORMAL LOW (ref 8.9–10.3)
Chloride: 101 mmol/L (ref 98–111)
Creatinine, Ser: 0.48 mg/dL (ref 0.44–1.00)
GFR, Estimated: >60 mL/min (ref >60)
Glucose, Bld: 122 mg/dL — ABNORMAL HIGH (ref 70–99)
Potassium: 3.8 mmol/L (ref 3.5–5.1)
Sodium: 135 mmol/L (ref 135–145)

## 2022-10-03 LAB — CBC
HCT: 28.3 % — ABNORMAL LOW (ref 36.0–46.0)
Hemoglobin: 7.6 g/dL — ABNORMAL LOW (ref 12.0–15.0)
MCH: 20.1 pg — ABNORMAL LOW (ref 26.0–34.0)
MCHC: 26.9 g/dL — ABNORMAL LOW (ref 30.0–36.0)
MCV: 74.7 fL — ABNORMAL LOW (ref 80.0–100.0)
Platelets: 375 10*3/uL (ref 150–400)
RBC: 3.79 MIL/uL — ABNORMAL LOW (ref 3.87–5.11)
RDW: 26.9 % — ABNORMAL HIGH (ref 11.5–15.5)
WBC: 7.3 10*3/uL (ref 4.0–10.5)
nRBC: 0 % (ref 0.0–0.2)

## 2022-10-03 MED ORDER — IOHEXOL 350 MG/ML SOLN
125.0000 mL | Freq: Once | INTRAVENOUS | Status: AC | PRN
  Administered 2022-10-03: 125 mL via INTRAVENOUS

## 2022-10-03 MED ORDER — SODIUM CHLORIDE 0.9 % IV SOLN
125.0000 mg | Freq: Once | INTRAVENOUS | Status: AC
  Administered 2022-10-03: 125 mg via INTRAVENOUS
  Filled 2022-10-03: qty 10

## 2022-10-03 MED ORDER — FERROUS SULFATE 325 (65 FE) MG PO TABS
325.0000 mg | ORAL_TABLET | ORAL | 0 refills | Status: AC
  Filled 2022-10-03: qty 100, 200d supply, fill #0

## 2022-10-03 NOTE — Consult Note (Signed)
WOC Nurse Consult Note: Reason for Consult:skin fold dermatitis Wound type:erythema intertrigo  ICD-10 CM Codes for Irritant Dermatitis  L24A9 - Due to friction or contact with other specified body fluids L30.4  - Erythema intertrigo. Also used for abrasion of the hand, chafing of the skin, dermatitis due to sweating and friction, friction dermatitis, friction eczema, and genital/thigh intertrigo.   Pressure Injury POA: N/A Measurement:all skin folds with moisture accumulation Wound bed:N/A Drainage (amount, consistency, odor) N/A Periwound:erythema, skin changes from chronic friction Dressing procedure/placement/frequency:I have provided Nursing with guidance for the care of these areas using a Vashe cleanse daily (pure hypochlorous acid) for its antifungal properties. The skinfold tissue is to be dried thoroughly, but gently and our house antimicrobial moisture wicking textile, InterDry placed according to the [employer] instructions. Those are provided in the orders and also here below:  InterDry skin fold moisture management: Order [employer] # [id number]:  Measure and cut length of InterDry to fit in skin folds that have skin breakdown Tuck InterDry fabric into skin folds in a single layer, allow for 2 inches of overhang from skin edges to allow for wicking to occur May remove to bathe; dry area thoroughly and then tuck into affected areas again Do not apply any creams or ointments when using InterDry DO NOT THROW AWAY FOR 5 DAYS unless soiled with stool DO NOT [facility] product, this will inactivate the silver in the material  New sheet of Interdry should be applied after 5 days of use if patient continues to have skin breakdown    A sacral foam is to be placed and heels floated while in bed for PI prevention. A bariatric mattress replacement with low air loss feature is provided due to body habitus.   WOC nursing team will not follow, but will remain available to this patient, the nursing  and medical teams.  Please re-consult if needed.  Thank you for inviting [place] to participate in this patient's Plan of Care.  Ladona Mow, MSN, RN, CNS, GNP, Leda Min, [employer], [employer] phone:  [phone]

## 2022-10-03 NOTE — Telephone Encounter (Signed)
Scheduled appointments per referrals. Patient is aware of the made appointments. Patient was given the address and was told to arrive 10-15 minutes early and to bring updated insurance documents.

## 2022-10-03 NOTE — Plan of Care (Signed)

## 2022-10-03 NOTE — Progress Notes (Signed)
Transition of Care [facility]) - Inpatient Brief Assessment   Patient Details  Name: Nicole Hurley MRN: [id number] Date of Birth: [birth year]  Transition of Care [facility]) CM/SW Contact:    Janae Bridgeman, RN Phone Number: 10/03/2022, 11:46 AM   Clinical Narrative: Patient was admitted from home with Acute respiratory failure and is waiting on ambulatory test today for oxygen saturations before being discharged home.  No TOC needs at this time.   Transition of Care Asessment: Insurance and Status: (P) Insurance coverage has been reviewed Patient has primary care physician: (P) Yes Home environment has been reviewed: (P) Yes Prior level of function:: (P) Independent Prior/Current Home Services: (P) No current home services Social Determinants of Health Reivew: (P) SDOH reviewed no interventions necessary Readmission risk has been reviewed: (P) Yes Transition of care needs: (P) no transition of care needs at this time

## 2022-10-03 NOTE — Progress Notes (Signed)
Pt refusing to wear oxygen at times. Pt will remove oxygen and then place oxygen back on. Pt has been educated on the importance of wearing O2 and supplemental oxygen therapy. Pt nursing plan of care ongoing.

## 2022-10-03 NOTE — Discharge Summary (Signed)
Physician Discharge Summary   Patient: Nicole Hurley MRN: [id number] DOB: 23-Oct-1985  Admit date:     10/02/2022  Discharge date: 10/03/22  Discharge Physician: Alberteen Sam   PCP: [facility]     Recommendations at discharge:  Follow-up with Carmel Sacramento in 1 day Bailey Mech: Please refer for sleep study Please obtain serial H/H to monitor improvement in Hgb Please ensure patient has follow-ups with Oncology and OB-Gyn  Please check iron studies in 4 to 6 weeks If not already done, please repeat FOBT to confirm (negative x1 in hospital) Please refer to endocrinology for guidance on whether or not (and how) to assess adrenal nodule from biochemical standpoint  Follow-up with oncology diagnostic clinic for lymphadenopathy in the axilla Follow-up with OB/GYN for menorrhagia     Discharge Diagnoses: Principal Problem:   Shortness of breath Active Problems:   Iron deficiency anemia due to chronic blood loss   Axillary adenopathy   Left adrenal mass (HCC)   Chest pain   Morbid obesity         Hospital Course: Mrs. Nicole Hurley is a 37 y.o. F with MO, iron deficiency anemia and who presented with shortness of breath with exertion.  Had been admitted 3 weeks ago with anemia.  Iron stores very low  Transfused blood, given IV iron.  FOBT negative at that time, reported history of menorrhagia.  Echo normal.  Discharge Hgb 7.4   Shortness of breath and chest pain Iron deficiency anemia Had persistent dyspnea with exertion, so returned to the hospital.  Here, Hgb improved from prior.  CT chest showed no edema, pneumonia, pericardial effusion, or PTX.  Troponins negative, ruling out ischemia.  D-dimer normal, rules out PE.  She ambulated and O2 was normal on room air.  She had symptoms with exertion, but in line with expected with Hgb in the 8s and her degree of obesity.  Given IV iron.  Recommend light duty, avoid exertion.    Follow up with PCP for  serial Hgbs.  Discharged with oral iron.    Axillary lymphadenopathy This was an incidental finding.  She has no skin infections which could cause it.  Other blood counts are normal.  No B symptoms.  CT abdomen shows a benign appearing abdominal mass only, no other lymphadenopathy.    Referral to Oncology diagnostic clinic for close interval appointment made.    Adrenal nodule This was another incidental finding.  CT adrenal protocol was nondiagnostic.  Discussed with MRI department, although technically under 225 kg weight limit, it would be very unlikely for her to fit in MRI to achieve an adrenal MRI.   - Would recommend Endocrinology follow up    Morbid obesity Patient noted to have somnolence and hypoxia with sleeping.  High risk for sleep apnea, recommend PSG.            The [facility] Controlled Substances Registry was reviewed for this patient prior to discharge.   Consultants: None Procedures performed: CTA chest CT adrenal protocol   Disposition: Home Diet recommendation:  Regular diet  DISCHARGE MEDICATION: Allergies as of 10/03/2022       Reactions   Benadryl [diphenhydramine Hcl (sleep)] Hives        Medication List     STOP taking these medications    ferrous gluconate 324 MG tablet Commonly known as: FERGON   meloxicam 7.5 MG tablet Commonly known as: MOBIC       TAKE these medications  acetaminophen 500 MG tablet Commonly known as: TYLENOL Take 500 mg by mouth every 6 (six) hours as needed for moderate pain.   albuterol 108 (90 Base) MCG/ACT inhaler Commonly known as: VENTOLIN HFA Inhale 1-2 puffs into the lungs every 6 (six) hours as needed for wheezing or shortness of breath.   FeroSul 325 (65 Fe) MG tablet Generic drug: ferrous sulfate Take 1 tablet (325 mg total) by mouth every other day.        Follow-up Information     Carmel Sacramento, NP Follow up.   Specialty: Nurse Practitioner Contact information: [address] [place] [address] [phone]         [facility] Follow up.   Specialty: Obstetrics and Gynecology Contact information: [address] [place] [address] [phone]        Briant Cedar, PA-C Follow up.   Specialty: Hematology and Oncology Why: [facility], they will arrange your follow up in the next few weeks Contact information: [address] [place] [address] [phone]                 Discharge Instructions     Ambulatory referral to Hematology / Oncology   Complete by: As directed    To Georga Kaufmann for the diagnostic clinic   Discharge instructions   Complete by: As directed    **IMPORTANT DISCHARGE INSTRUCTIONS**   From Dr. Maryfrances Bunnell: You were admitted for chest discomfort and shortness of breath Here, we did a CT of your chest that showed no findings that would cause shortness of breath or pain. This imaging also showed no infection, which is good. We did blood work that ruled out heart failure, heart attack, or blood clots. You had a recent echocardiogram (ultrasound of the heart) which was reassuring.  Certainly, anemia alone is likely to cause shortness of breath.  Given your weight, this is likely worse than average.  I recommend you perform light duty at work, until your anemia is better  To improve the anemia, take iron supplements daily  (iron supplements are usually in the form of "ferrous sulfate") Take ferrous sulfate 325 mg every other day  Go see Carmel Sacramento tomorrow  I have sent a referral to the OBGyn office, they should call you in the next week to schedule you  If they haven't called, call the number below.  Also, on your CT chest we found an "incidental finding" (something we weren;t looking for, and not sure what it means) Specifically, we saw that you have enlarged lymph nodes in your  armpits  I have sent a referral for you to the Oncology clinic for follow up.  They will contact you to be seen and determine next steps (whether to monitor it or whether to biopsy it, and take a sample of the lymph node to analyze under the microscope)  If you start to have night sweats, fevers, or weight loss, tell Carmel Sacramento right away in reference to the armpit lymph nodes   Increase activity slowly   Complete by: As directed        Discharge Exam: Filed Weights   10/02/22 1557 10/03/22 0517  Weight: (!) 190.5 kg (!) 192.8 kg    General: Pt is alert, awake, not in acute distress Cardiovascular: RRR, nl S1-S2, no murmurs appreciated.   No LE edema.   Respiratory: Normal respiratory rate and rhythm.  CTAB without  rales or wheezes. Abdominal: Abdomen soft and non-tender.  No distension or HSM.   Neuro/Psych: Strength symmetric in upper and lower extremities.  Judgment and insight appear normal.   Condition at discharge: stable  The results of significant diagnostics from this hospitalization (including imaging, microbiology, ancillary and laboratory) are listed below for reference.   Imaging Studies: CT ABDOMEN W WO CONTRAST  Result Date: 10/03/2022 CLINICAL DATA:  Abdominal mass. Intra-abdominal neoplasm suspected. * Tracking Code: BO * EXAM: CT ABDOMEN WITHOUT AND WITH CONTRAST TECHNIQUE: Multidetector CT imaging of the abdomen was performed following the standard protocol before and following the bolus administration of intravenous contrast. RADIATION DOSE REDUCTION: This exam was performed according to the departmental dose-optimization program which includes automated exposure control, adjustment of the mA and/or kV according to patient size and/or use of iterative reconstruction technique. CONTRAST:  OMNIPAQUE IOHEXOL 350 MG/ML SOLN COMPARISON:  None Available. FINDINGS: Lower chest:  Lung bases are clear. Hepatobiliary: No focal hepatic lesion.  Postcholecystectomy.  Anterior to the LEFT hepatic lobe there is a lobular mass with peripheral calcification measuring 5.3 x 3.7 cm (image 11/series 3). This mass is increased in size from 2.7 x 2.1 cm on CT 02/13/2017. mass was more rounded in 2017 measuring 21 x 21 mm. Pancreas: Normal pancreatic parenchymal intensity. No ductal dilatation or inflammation. Spleen: Normal spleen. Adrenals/urinary tract: Rounded enlargement of the LEFT adrenal gland 2 22 mm in diameter. This is increased in size from comparison CTs. Lesion cannot be characterized as benign adenoma on this single contrast exam as Hounsfield units equal proximally 20. RIGHT adrenal gland normal. Kidneys normal. Stomach/Bowel: Stomach and limited of the small bowel is unremarkable Vascular/Lymphatic: Abdominal aortic normal caliber. No retroperitoneal periportal lymphadenopathy. Musculoskeletal: No aggressive osseous lesion IMPRESSION: 1. Enlargement of the LEFT adrenal gland is new from comparison exams. Favor benign adenoma however cannot be characterized as such on current exam. Recommend MRI adrenal protocol for further evaluation. 2. Interval enlargement of benign-appearing lobular soft tissue mass anterior to the LEFT hepatic lobe. Mass has increased significantly in size from 2017. Mass could be included in imaging of the adrenal glands recommended above. Electronically Signed   By: Genevive Bi M.D.   On: 10/03/2022 11:41   CT Angio Chest PE W and/or Wo Contrast  Result Date: 10/02/2022 CLINICAL DATA:  Headache with chest pain and shortness of breath. EXAM: CT ANGIOGRAPHY CHEST WITH CONTRAST TECHNIQUE: Multidetector CT imaging of the chest was performed using the standard protocol during bolus administration of intravenous contrast. Multiplanar CT image reconstructions and MIPs were obtained to evaluate the vascular anatomy. RADIATION DOSE REDUCTION: This exam was performed according to the departmental dose-optimization program which includes automated  exposure control, adjustment of the mA and/or kV according to patient size and/or use of iterative reconstruction technique. CONTRAST:  75mL OMNIPAQUE IOHEXOL 350 MG/ML SOLN COMPARISON:  None Available. FINDINGS: Cardiovascular: The thoracic aorta is normal in appearance. The pulmonary arteries are markedly limited in evaluation secondary to suboptimal opacification with intravenous contrast. Normal heart size. No pericardial effusion. Mediastinum/Nodes: There is moderate severity bilateral axillary lymphadenopathy. The largest axillary lymph node measures approximately 3.2 cm x 1.5 cm (axial CT image 68, CT series 5). No enlarged mediastinal or hilar lymph nodes. Thyroid gland, trachea, and esophagus demonstrate no significant findings. Lungs/Pleura: Lungs are clear. No pleural effusion or pneumothorax. Upper Abdomen: An 18 mm diameter low-attenuation left adrenal mass is seen (approximately 24.02 Hounsfield units). Musculoskeletal: No chest wall abnormality. No acute or significant  osseous findings. Review of the MIP images confirms the above findings. IMPRESSION: 1. Markedly limited evaluation of the pulmonary arteries secondary to suboptimal opacification with intravenous contrast. 2. Moderate severity bilateral axillary lymphadenopathy. 3. 18 mm diameter low-attenuation left adrenal mass, likely consistent with an adrenal adenoma. Correlation with nonemergent adrenal protocol CT is recommended. This recommendation follows ACR consensus guidelines: Management of Incidental Adrenal Masses: A White Paper of the ACR Incidental Findings Committee. J Am Coll Radiol 2017;14:1038-1044. Electronically Signed   By: Aram Candela M.D.   On: 10/02/2022 20:43   DG Chest 2 View  Result Date: 10/02/2022 CLINICAL DATA:  Shortness of breath EXAM: CHEST - 2 VIEW COMPARISON:  Chest x-ray September 12, 2022 FINDINGS: Unchanged cardiomegaly and mediastinal contours. Persistent bilateral diffuse interstitial pulmonary opacities.  No pleural effusion or pneumothorax. No acute osseous abnormality. The visualized upper abdomen is unremarkable. IMPRESSION: 1. Bilateral diffuse interstitial pulmonary opacities, likely mild pulmonary edema. 2. Unchanged cardiomegaly. Electronically Signed   By: Jacob Moores M.D.   On: 10/02/2022 16:32   ECHOCARDIOGRAM COMPLETE  Result Date: 09/13/2022    ECHOCARDIOGRAM REPORT   Patient Name:   Nicole Hurley Date of Exam: 09/13/2022 Medical Rec #:  [id number]           Height:       66.0 in Accession #:    [id number]          Weight:       420.0 lb Date of Birth:  December 05, 1985           BSA:          2.741 m Patient Age:    36 years            BP:           125/63 mmHg Patient Gender: F                   HR:           93 bpm. Exam Location:  Inpatient Procedure: 2D Echo, Cardiac Doppler, Color Doppler and Intracardiac            Opacification Agent Indications:    CHF - Acute Diastolic  History:        Patient has no prior history of Echocardiogram examinations.                 Signs/Symptoms:Fatigue; Risk Factors:Hypertension and Current                 Smoker.  Sonographer:    Wallie Char Referring Phys: 82 Daylene Katayama [facility]  Sonographer Comments: Technically difficult study due to poor echo windows and patient is obese. Image acquisition challenging due to patient body habitus. IMPRESSIONS  1. Left ventricular ejection fraction, by estimation, is 60 to 65%. The left ventricle has normal function. The left ventricle has no regional wall motion abnormalities. There is moderate concentric left ventricular hypertrophy. Left ventricular diastolic function could not be evaluated.  2. Right ventricular systolic function was not well visualized. The right ventricular size is not well visualized.  3. The mitral valve was not well visualized. No evidence of mitral valve regurgitation. No evidence of mitral stenosis.  4. The aortic valve was not well visualized. Aortic valve regurgitation is not visualized.  No aortic stenosis is present.  5. The inferior vena cava is dilated in size with <50% respiratory variability, suggesting right atrial pressure of 15 mmHg. Comparison(s): No prior Echocardiogram. Conclusion(s)/Recommendation(s): Very limited images  without echo contrast--valves not well seen but can be assessed by Doppler. Normal wall motion with contrast. FINDINGS  Left Ventricle: Left ventricular ejection fraction, by estimation, is 60 to 65%. The left ventricle has normal function. The left ventricle has no regional wall motion abnormalities. Definity contrast agent was given IV to delineate the left ventricular  endocardial borders. The left ventricular internal cavity size was normal in size. There is moderate concentric left ventricular hypertrophy. Left ventricular diastolic function could not be evaluated due to nondiagnostic images. Left ventricular diastolic function could not be evaluated. Right Ventricle: The right ventricular size is not well visualized. Right vetricular wall thickness was not well visualized. Right ventricular systolic function was not well visualized. Left Atrium: Left atrial size was not well visualized. Right Atrium: Right atrial size was not well visualized. Pericardium: There is no evidence of pericardial effusion. Mitral Valve: The mitral valve was not well visualized. No evidence of mitral valve regurgitation. No evidence of mitral valve stenosis. MV peak gradient, 7.3 mmHg. The mean mitral valve gradient is 5.0 mmHg. Tricuspid Valve: The tricuspid valve is not well visualized. Tricuspid valve regurgitation is not demonstrated. No evidence of tricuspid stenosis. Aortic Valve: The aortic valve was not well visualized. Aortic valve regurgitation is not visualized. No aortic stenosis is present. Aortic valve mean gradient measures 7.0 mmHg. Aortic valve peak gradient measures 12.2 mmHg. Aortic valve area, by VTI measures 3.45 cm. Pulmonic Valve: The pulmonic valve was not well  visualized. Pulmonic valve regurgitation is not visualized. No evidence of pulmonic stenosis. Aorta: The aortic root was not well visualized and the ascending aorta was not well visualized. Venous: The inferior vena cava is dilated in size with less than 50% respiratory variability, suggesting right atrial pressure of 15 mmHg. IAS/Shunts: The interatrial septum was not well visualized.  LEFT VENTRICLE PLAX 2D LVIDd:         6.20 cm      Diastology LVIDs:         4.10 cm      LV e' medial:    9.34 cm/s LV PW:         1.70 cm      LV E/e' medial:  11.3 LV IVS:        1.70 cm      LV e' lateral:   10.70 cm/s LVOT diam:     2.40 cm      LV E/e' lateral: 9.9 LV SV:         116 LV SV Index:   42 LVOT Area:     4.52 cm  LV Volumes (MOD) LV vol d, MOD A2C: 183.0 ml LV vol d, MOD A4C: 242.0 ml LV vol s, MOD A2C: 61.1 ml LV vol s, MOD A4C: 88.3 ml LV SV MOD A2C:     121.9 ml LV SV MOD A4C:     242.0 ml LV SV MOD BP:      136.0 ml RIGHT VENTRICLE         IVC TAPSE (M-mode): 4.4 cm  IVC diam: 3.50 cm LEFT ATRIUM             Index LA diam:        4.40 cm 1.61 cm/m LA Vol (A2C):   55.2 ml 20.14 ml/m LA Vol (A4C):   70.0 ml 25.54 ml/m LA Biplane Vol: 67.1 ml 24.48 ml/m  AORTIC VALVE AV Area (Vmax):    3.90 cm AV Area (Vmean):   3.60 cm AV  Area (VTI):     3.45 cm AV Vmax:           175.00 cm/s AV Vmean:          121.000 cm/s AV VTI:            0.337 m AV Peak Grad:      12.2 mmHg AV Mean Grad:      7.0 mmHg LVOT Vmax:         151.00 cm/s LVOT Vmean:        96.200 cm/s LVOT VTI:          0.257 m LVOT/AV VTI ratio: 0.76  AORTA Ao Root diam: 3.30 cm Ao Asc diam:  3.20 cm MITRAL VALVE MV Area (PHT): 3.08 cm     SHUNTS MV Area VTI:   3.41 cm     Systemic VTI:  0.26 m MV Peak grad:  7.3 mmHg     Systemic Diam: 2.40 cm MV Mean grad:  5.0 mmHg MV Vmax:       1.35 m/s MV Vmean:      105.0 cm/s MV Decel Time: 246 msec MV E velocity: 106.00 cm/s MV A velocity: 93.10 cm/s MV E/A ratio:  1.14 Jodelle Red MD Electronically  signed by Jodelle Red MD Signature Date/Time: 09/13/2022/3:34:58 PM    Final    CT Head Wo Contrast  Result Date: 09/12/2022 CLINICAL DATA:  Headaches EXAM: CT HEAD WITHOUT CONTRAST TECHNIQUE: Contiguous axial images were obtained from the base of the skull through the vertex without intravenous contrast. RADIATION DOSE REDUCTION: This exam was performed according to the departmental dose-optimization program which includes automated exposure control, adjustment of the mA and/or kV according to patient size and/or use of iterative reconstruction technique. COMPARISON:  None Available. FINDINGS: Brain: No acute intracranial findings are seen. There are no signs of bleeding within the cranium. Ventricles are not dilated. There is no focal edema or mass effect. Vascular: Unremarkable. Skull: No acute findings are seen. Sinuses/Orbits: Unremarkable. Other: None. IMPRESSION: No acute intracranial findings are seen in noncontrast CT brain. Electronically Signed   By: Ernie Avena M.D.   On: 09/12/2022 18:52   DG Chest Port 1 View  Result Date: 09/12/2022 CLINICAL DATA:  Anemia, tachypnea, dizziness, headache, nausea EXAM: PORTABLE CHEST 1 VIEW COMPARISON:  01/31/2019 FINDINGS: Cardiomegaly. Pulmonary vascular prominence and diffuse bilateral interstitial pulmonary opacity. The visualized skeletal structures are unremarkable. IMPRESSION: Cardiomegaly with pulmonary vascular prominence and diffuse bilateral interstitial pulmonary opacity, likely edema. No focal airspace opacity. Electronically Signed   By: Jearld Lesch M.D.   On: 09/12/2022 15:20    Microbiology: Results for orders placed or performed during the hospital encounter of 04/03/20  SARS CORONAVIRUS 2 (TAT 6-24 HRS) Nasopharyngeal Nasopharyngeal Swab     Status: Abnormal   Collection Time: 04/03/20  8:09 PM   Specimen: Nasopharyngeal Swab  Result Value Ref Range Status   SARS Coronavirus 2 POSITIVE (A) NEGATIVE Final    Comment:  (NOTE) SARS-CoV-2 target nucleic acids are DETECTED.  The SARS-CoV-2 RNA is generally detectable in upper and lower respiratory specimens during the acute phase of infection. Positive results are indicative of the presence of SARS-CoV-2 RNA. Clinical correlation with patient history and other diagnostic information is  necessary to determine patient infection status. Positive results do not rule out bacterial infection or co-infection with other viruses.  The expected result is Negative.  Fact Sheet for Patients: HairSlick.no  Fact Sheet for Healthcare Providers: quierodirigir.com  This test is  not yet approved or cleared by the [place] and  has been authorized for detection and/or diagnosis of SARS-CoV-2 by FDA under an Emergency Use Authorization (EUA). This EUA will remain  in effect (meaning this test can be used) for the duration of the COVID-19 declaration under Section 564(b)(1) of the Act, 21 U. S.C. section 360bbb-3(b)(1), unless the authorization is terminated or revoked sooner.   Performed at [facility], [address] [place], [place] [address]     Labs: CBC: Recent Labs  Lab 10/02/22 1612 10/03/22 0717  WBC 8.6 7.3  HGB 8.1* 7.6*  HCT 30.2* 28.3*  MCV 74.8* 74.7*  PLT 416* 375   Basic Metabolic Panel: Recent Labs  Lab 10/02/22 1612 10/03/22 0717  NA 139 135  K 3.5 3.8  CL 102 101  CO2 23 26  GLUCOSE 132* 122*  BUN 6 6  CREATININE 0.73 0.48  CALCIUM 8.6* 8.3*   Liver Function Tests: No results for input(s): "AST", "ALT", "ALKPHOS", "BILITOT", "PROT", "ALBUMIN" in the last 168 hours. CBG: No results for input(s): "GLUCAP" in the last 168 hours.  Discharge time spent: approximately 35 minutes spent on discharge counseling, evaluation of patient on day of discharge, and coordination of discharge planning with nursing, social work, pharmacy and case  management  Signed: Alberteen Sam, MD [facility] 10/03/2022

## 2022-10-05 NOTE — Progress Notes (Unsigned)
[facility] Telephone:([phone]   [phone]  INITIAL CONSULTATION:  Patient Care Team: Center, [facility] as PCP - General (Hematology and Oncology)  CHIEF COMPLAINTS/PURPOSE OF CONSULTATION:  "Axillary lymphadenopathy "  HISTORY OF PRESENTING ILLNESS:  Nicole Hurley 37 y.o. female with medical history significant for iron deficiency anemia due to chronic blood loss.  On review of the previous records patient had incidental finding of bilateral axillary lymphadenopathy. Patent presented to the ED on 10/02/22 for shortness of breath thought to be related to her anemia however hemoglobin was higher than recent hospitalization at 8.1. Patient was noted to be hypoxic with ambulation therefore ED provider ordered CTA chest to rule out PE. Patient did receive Ferrlecit infusion while in the ED. Imaging showed there is moderate severity bilateral axillary lymphadenopathy. The largest axillary lymph node measures approximately 3.2 cm x 1.5 cm. There was no enlarged mediastinal or hilar lymph nodes. Patient was also noted to have 18 mm diameter low-attenuation left adrenal mass for which endocrinology referral was made by the hospitalist. Patient had previous hospital admission in June 2022 for new finding of anemia and received blood transfusion as well as Feraheme infusion. She is currently taking PO iron.  On exam today patient reports she is anxious from her multiple recent medical issues. She reports she recently established care with a pcp, otherwise had not received routine medical care in quite some time. Patient states her menstrual cycles have been very heavy starting 4 months ago. She saturates sanitary napkins within an hour and her cycle typically lasts 7 days. LMP was x 7 days ago. Patient states is not currently followed by a provider for her anemia. Patient denies history of swelling of axillary lymph nodes and was surprised of  that finding on the CT scan. She reports some ingrown hairs in her underarms that she thinks might be from shaving. She noticed one on in right axilla x 2 weeks ago and stopped shaving at that time. She denies any associated pain or history of recurrent abscesses. In regards to the adrenal mass also found on CT patient denies elevated blood pressure, unexplained weight gain or weakness, or increased thirst or urination.  Patient denies any family history of malignancy. She has never had a mammogram. She thinks her last pap smear was in 2020 and remembers it being normal. Patient admits to 1/2 ppd x 10 year smoking history and recently cut back to 3 cigarettes per day. She occasionally consumes alcohol and recently started smoking marijuana to help with her anxiety.     MEDICAL HISTORY:  Past Medical History:  Diagnosis Date   Gallstones 09/2017   Hypertension     SURGICAL HISTORY: Past Surgical History:  Procedure Laterality Date   CESAREAN SECTION     CHOLECYSTECTOMY N/A 10/22/2017   Procedure: LAPAROSCOPIC CHOLECYSTECTOMY WITH POSSIBLE INTRAOPERATIVE CHOLANGIOGRAM;  Surgeon: Harriette Bouillon, MD;  Location: [facility] OR;  Service: General;  Laterality: N/A;   DENTAL SURGERY      SOCIAL HISTORY: Social History   Socioeconomic History   Marital status: Single    Spouse name: Not on file   Number of children: Not on file   Years of education: Not on file   Highest education level: Not on file  Occupational History   Not on file  Tobacco Use   Smoking status: Former    Types: Cigarettes   Smokeless tobacco: Never  Vaping Use   Vaping status: Never Used  Substance  and Sexual Activity   Alcohol use: Yes   Drug use: No   Sexual activity: Not on file  Other Topics Concern   Not on file  Social History Narrative   Not on file   Social Determinants of Health   Financial Resource Strain: Not on file  Food Insecurity: Food Insecurity Present (10/02/2022)   Hunger Vital Sign    Worried  About Running Out of Food in the Last Year: Sometimes true    Ran Out of Food in the Last Year: Sometimes true  Transportation Needs: No Transportation Needs (10/02/2022)   PRAPARE - Administrator, Civil Service (Medical): No    Lack of Transportation (Non-Medical): No  Physical Activity: Not on file  Stress: Not on file  Social Connections: Not on file  Intimate Partner Violence: Not At Risk (10/02/2022)   Humiliation, Afraid, Rape, and Kick questionnaire    Fear of Current or Ex-Partner: No    Emotionally Abused: No    Physically Abused: No    Sexually Abused: No    FAMILY HISTORY: Family History  Problem Relation Age of Onset   Healthy Mother    Healthy Father     ALLERGIES:  is allergic to benadryl [diphenhydramine hcl (sleep)].  MEDICATIONS:  Current Outpatient Medications  Medication Sig Dispense Refill   acetaminophen (TYLENOL) 500 MG tablet Take 500 mg by mouth every 6 (six) hours as needed for moderate pain.     albuterol (VENTOLIN HFA) 108 (90 Base) MCG/ACT inhaler Inhale 1-2 puffs into the lungs every 6 (six) hours as needed for wheezing or shortness of breath. 18 g 0   ferrous sulfate 325 (65 FE) MG tablet Take 1 tablet (325 mg total) by mouth every other day. 100 tablet 0   No current facility-administered medications for this visit.    REVIEW OF SYSTEMS:   All other systems are reviewed and are negative for acute change except as noted in the HPI.  PHYSICAL EXAMINATION: ECOG PERFORMANCE STATUS: 1 - Symptomatic but completely ambulatory  Vitals:   10/06/22 1312  BP: 135/86  Pulse: 88  Resp: 20  Temp: 98 F (36.7 C)  SpO2: 93%   Filed Weights   10/06/22 1312  Weight: (!) 429 lb 3.2 oz (194.7 kg)    Physical Exam Vitals reviewed.  Constitutional:      Appearance: She is obese. She is not ill-appearing or toxic-appearing.  HENT:     Head: Normocephalic.     Right Ear: External ear normal.     Left Ear: External ear normal.     Nose:  Nose normal.     Mouth/Throat:     Mouth: Mucous membranes are moist.  Eyes:     Conjunctiva/sclera: Conjunctivae normal.  Cardiovascular:     Rate and Rhythm: Normal rate and regular rhythm.     Pulses: Normal pulses.     Heart sounds: Normal heart sounds.  Pulmonary:     Effort: Pulmonary effort is normal.     Breath sounds: Normal breath sounds.  Abdominal:     General: There is no distension.  Musculoskeletal:        General: Normal range of motion.     Cervical back: Normal range of motion.  Lymphadenopathy:     Head:     Right side of head: No submental, submandibular, tonsillar, preauricular, posterior auricular or occipital adenopathy.     Left side of head: No submental, submandibular, tonsillar, preauricular, posterior auricular or occipital adenopathy.  Cervical: No cervical adenopathy.     Upper Body:     Right upper body: No supraclavicular, axillary or pectoral adenopathy.     Left upper body: No supraclavicular, axillary or pectoral adenopathy.     Lower Body: No right inguinal adenopathy. No left inguinal adenopathy.  Skin:    General: Skin is warm and dry.  Neurological:     Mental Status: She is alert.       LABORATORY DATA:  I have reviewed the data as listed    Latest Ref Rng & Units 10/06/2022    2:44 PM 10/03/2022    7:17 AM 10/02/2022    4:12 PM  CBC  WBC 4.0 - 10.5 K/uL 10.8  7.3  8.6   Hemoglobin 12.0 - 15.0 g/dL 8.4  7.6  8.1   Hematocrit 36.0 - 46.0 % 31.0  28.3  30.2   Platelets 150 - 400 K/uL 398  375  416        Latest Ref Rng & Units 10/06/2022    2:44 PM 10/03/2022    7:17 AM 10/02/2022    4:12 PM  CMP  Glucose 70 - 99 mg/dL 308  657  846   BUN 6 - 20 mg/dL 8  6  6    Creatinine 0.44 - 1.00 mg/dL 9.62  9.52  8.41   Sodium 135 - 145 mmol/L 137  135  139   Potassium 3.5 - 5.1 mmol/L 3.6  3.8  3.5   Chloride 98 - 111 mmol/L 103  101  102   CO2 22 - 32 mmol/L 28  26  23    Calcium 8.9 - 10.3 mg/dL 8.9  8.3  8.6   Total Protein 6.5  - 8.1 g/dL 8.7     Total Bilirubin 0.3 - 1.2 mg/dL 0.2     Alkaline Phos 38 - 126 U/L 86     AST 15 - 41 U/L 12     ALT 0 - 44 U/L 11        RADIOGRAPHIC STUDIES: I have personally reviewed the radiological images as listed and agreed with the findings in the report. CT ABDOMEN W WO CONTRAST  Result Date: 10/03/2022 CLINICAL DATA:  Abdominal mass. Intra-abdominal neoplasm suspected. * Tracking Code: BO * EXAM: CT ABDOMEN WITHOUT AND WITH CONTRAST TECHNIQUE: Multidetector CT imaging of the abdomen was performed following the standard protocol before and following the bolus administration of intravenous contrast. RADIATION DOSE REDUCTION: This exam was performed according to the departmental dose-optimization program which includes automated exposure control, adjustment of the mA and/or kV according to patient size and/or use of iterative reconstruction technique. CONTRAST:  OMNIPAQUE IOHEXOL 350 MG/ML SOLN COMPARISON:  None Available. FINDINGS: Lower chest:  Lung bases are clear. Hepatobiliary: No focal hepatic lesion.  Postcholecystectomy. Anterior to the LEFT hepatic lobe there is a lobular mass with peripheral calcification measuring 5.3 x 3.7 cm (image 11/series 3). This mass is increased in size from 2.7 x 2.1 cm on CT 02/13/2017. mass was more rounded in 2017 measuring 21 x 21 mm. Pancreas: Normal pancreatic parenchymal intensity. No ductal dilatation or inflammation. Spleen: Normal spleen. Adrenals/urinary tract: Rounded enlargement of the LEFT adrenal gland 2 22 mm in diameter. This is increased in size from comparison CTs. Lesion cannot be characterized as benign adenoma on this single contrast exam as Hounsfield units equal proximally 20. RIGHT adrenal gland normal. Kidneys normal. Stomach/Bowel: Stomach and limited of the small bowel is unremarkable Vascular/Lymphatic: Abdominal aortic normal  caliber. No retroperitoneal periportal lymphadenopathy. Musculoskeletal: No aggressive osseous  lesion IMPRESSION: 1. Enlargement of the LEFT adrenal gland is new from comparison exams. Favor benign adenoma however cannot be characterized as such on current exam. Recommend MRI adrenal protocol for further evaluation. 2. Interval enlargement of benign-appearing lobular soft tissue mass anterior to the LEFT hepatic lobe. Mass has increased significantly in size from 2017. Mass could be included in imaging of the adrenal glands recommended above. Electronically Signed   By: Genevive Bi M.D.   On: 10/03/2022 11:41   CT Angio Chest PE W and/or Wo Contrast  Result Date: 10/02/2022 CLINICAL DATA:  Headache with chest pain and shortness of breath. EXAM: CT ANGIOGRAPHY CHEST WITH CONTRAST TECHNIQUE: Multidetector CT imaging of the chest was performed using the standard protocol during bolus administration of intravenous contrast. Multiplanar CT image reconstructions and MIPs were obtained to evaluate the vascular anatomy. RADIATION DOSE REDUCTION: This exam was performed according to the departmental dose-optimization program which includes automated exposure control, adjustment of the mA and/or kV according to patient size and/or use of iterative reconstruction technique. CONTRAST:  75mL OMNIPAQUE IOHEXOL 350 MG/ML SOLN COMPARISON:  None Available. FINDINGS: Cardiovascular: The thoracic aorta is normal in appearance. The pulmonary arteries are markedly limited in evaluation secondary to suboptimal opacification with intravenous contrast. Normal heart size. No pericardial effusion. Mediastinum/Nodes: There is moderate severity bilateral axillary lymphadenopathy. The largest axillary lymph node measures approximately 3.2 cm x 1.5 cm (axial CT image 68, CT series 5). No enlarged mediastinal or hilar lymph nodes. Thyroid gland, trachea, and esophagus demonstrate no significant findings. Lungs/Pleura: Lungs are clear. No pleural effusion or pneumothorax. Upper Abdomen: An 18 mm diameter low-attenuation left  adrenal mass is seen (approximately 24.02 Hounsfield units). Musculoskeletal: No chest wall abnormality. No acute or significant osseous findings. Review of the MIP images confirms the above findings. IMPRESSION: 1. Markedly limited evaluation of the pulmonary arteries secondary to suboptimal opacification with intravenous contrast. 2. Moderate severity bilateral axillary lymphadenopathy. 3. 18 mm diameter low-attenuation left adrenal mass, likely consistent with an adrenal adenoma. Correlation with nonemergent adrenal protocol CT is recommended. This recommendation follows ACR consensus guidelines: Management of Incidental Adrenal Masses: A White Paper of the ACR Incidental Findings Committee. J Am Coll Radiol 2017;14:1038-1044. Electronically Signed   By: Aram Candela M.D.   On: 10/02/2022 20:43   DG Chest 2 View  Result Date: 10/02/2022 CLINICAL DATA:  Shortness of breath EXAM: CHEST - 2 VIEW COMPARISON:  Chest x-ray September 12, 2022 FINDINGS: Unchanged cardiomegaly and mediastinal contours. Persistent bilateral diffuse interstitial pulmonary opacities. No pleural effusion or pneumothorax. No acute osseous abnormality. The visualized upper abdomen is unremarkable. IMPRESSION: 1. Bilateral diffuse interstitial pulmonary opacities, likely mild pulmonary edema. 2. Unchanged cardiomegaly. Electronically Signed   By: Jacob Moores M.D.   On: 10/02/2022 16:32   ECHOCARDIOGRAM COMPLETE  Result Date: 09/13/2022    ECHOCARDIOGRAM REPORT   Patient Name:   LASHAWNTA BURGERT Date of Exam: 09/13/2022 Medical Rec #:  [id number]           Height:       66.0 in Accession #:    [id number]          Weight:       420.0 lb Date of Birth:  1985/05/26           BSA:          2.741 m Patient Age:    36 years  BP:           125/63 mmHg Patient Gender: F                   HR:           93 bpm. Exam Location:  Inpatient Procedure: 2D Echo, Cardiac Doppler, Color Doppler and Intracardiac            Opacification  Agent Indications:    CHF - Acute Diastolic  History:        Patient has no prior history of Echocardiogram examinations.                 Signs/Symptoms:Fatigue; Risk Factors:Hypertension and Current                 Smoker.  Sonographer:    Wallie Char Referring Phys: 21 Daylene Katayama [facility]  Sonographer Comments: Technically difficult study due to poor echo windows and patient is obese. Image acquisition challenging due to patient body habitus. IMPRESSIONS  1. Left ventricular ejection fraction, by estimation, is 60 to 65%. The left ventricle has normal function. The left ventricle has no regional wall motion abnormalities. There is moderate concentric left ventricular hypertrophy. Left ventricular diastolic function could not be evaluated.  2. Right ventricular systolic function was not well visualized. The right ventricular size is not well visualized.  3. The mitral valve was not well visualized. No evidence of mitral valve regurgitation. No evidence of mitral stenosis.  4. The aortic valve was not well visualized. Aortic valve regurgitation is not visualized. No aortic stenosis is present.  5. The inferior vena cava is dilated in size with <50% respiratory variability, suggesting right atrial pressure of 15 mmHg. Comparison(s): No prior Echocardiogram. Conclusion(s)/Recommendation(s): Very limited images without echo contrast--valves not well seen but can be assessed by Doppler. Normal wall motion with contrast. FINDINGS  Left Ventricle: Left ventricular ejection fraction, by estimation, is 60 to 65%. The left ventricle has normal function. The left ventricle has no regional wall motion abnormalities. Definity contrast agent was given IV to delineate the left ventricular  endocardial borders. The left ventricular internal cavity size was normal in size. There is moderate concentric left ventricular hypertrophy. Left ventricular diastolic function could not be evaluated due to nondiagnostic images. Left  ventricular diastolic function could not be evaluated. Right Ventricle: The right ventricular size is not well visualized. Right vetricular wall thickness was not well visualized. Right ventricular systolic function was not well visualized. Left Atrium: Left atrial size was not well visualized. Right Atrium: Right atrial size was not well visualized. Pericardium: There is no evidence of pericardial effusion. Mitral Valve: The mitral valve was not well visualized. No evidence of mitral valve regurgitation. No evidence of mitral valve stenosis. MV peak gradient, 7.3 mmHg. The mean mitral valve gradient is 5.0 mmHg. Tricuspid Valve: The tricuspid valve is not well visualized. Tricuspid valve regurgitation is not demonstrated. No evidence of tricuspid stenosis. Aortic Valve: The aortic valve was not well visualized. Aortic valve regurgitation is not visualized. No aortic stenosis is present. Aortic valve mean gradient measures 7.0 mmHg. Aortic valve peak gradient measures 12.2 mmHg. Aortic valve area, by VTI measures 3.45 cm. Pulmonic Valve: The pulmonic valve was not well visualized. Pulmonic valve regurgitation is not visualized. No evidence of pulmonic stenosis. Aorta: The aortic root was not well visualized and the ascending aorta was not well visualized. Venous: The inferior vena cava is dilated in size with less than 50% respiratory variability,  suggesting right atrial pressure of 15 mmHg. IAS/Shunts: The interatrial septum was not well visualized.  LEFT VENTRICLE PLAX 2D LVIDd:         6.20 cm      Diastology LVIDs:         4.10 cm      LV e' medial:    9.34 cm/s LV PW:         1.70 cm      LV E/e' medial:  11.3 LV IVS:        1.70 cm      LV e' lateral:   10.70 cm/s LVOT diam:     2.40 cm      LV E/e' lateral: 9.9 LV SV:         116 LV SV Index:   42 LVOT Area:     4.52 cm  LV Volumes (MOD) LV vol d, MOD A2C: 183.0 ml LV vol d, MOD A4C: 242.0 ml LV vol s, MOD A2C: 61.1 ml LV vol s, MOD A4C: 88.3 ml LV SV MOD  A2C:     121.9 ml LV SV MOD A4C:     242.0 ml LV SV MOD BP:      136.0 ml RIGHT VENTRICLE         IVC TAPSE (M-mode): 4.4 cm  IVC diam: 3.50 cm LEFT ATRIUM             Index LA diam:        4.40 cm 1.61 cm/m LA Vol (A2C):   55.2 ml 20.14 ml/m LA Vol (A4C):   70.0 ml 25.54 ml/m LA Biplane Vol: 67.1 ml 24.48 ml/m  AORTIC VALVE AV Area (Vmax):    3.90 cm AV Area (Vmean):   3.60 cm AV Area (VTI):     3.45 cm AV Vmax:           175.00 cm/s AV Vmean:          121.000 cm/s AV VTI:            0.337 m AV Peak Grad:      12.2 mmHg AV Mean Grad:      7.0 mmHg LVOT Vmax:         151.00 cm/s LVOT Vmean:        96.200 cm/s LVOT VTI:          0.257 m LVOT/AV VTI ratio: 0.76  AORTA Ao Root diam: 3.30 cm Ao Asc diam:  3.20 cm MITRAL VALVE MV Area (PHT): 3.08 cm     SHUNTS MV Area VTI:   3.41 cm     Systemic VTI:  0.26 m MV Peak grad:  7.3 mmHg     Systemic Diam: 2.40 cm MV Mean grad:  5.0 mmHg MV Vmax:       1.35 m/s MV Vmean:      105.0 cm/s MV Decel Time: 246 msec MV E velocity: 106.00 cm/s MV A velocity: 93.10 cm/s MV E/A ratio:  1.14 Jodelle Red MD Electronically signed by Jodelle Red MD Signature Date/Time: 09/13/2022/3:34:58 PM    Final    CT Head Wo Contrast  Result Date: 09/12/2022 CLINICAL DATA:  Headaches EXAM: CT HEAD WITHOUT CONTRAST TECHNIQUE: Contiguous axial images were obtained from the base of the skull through the vertex without intravenous contrast. RADIATION DOSE REDUCTION: This exam was performed according to the departmental dose-optimization program which includes automated exposure control, adjustment of the mA and/or kV according to patient size and/or use of  iterative reconstruction technique. COMPARISON:  None Available. FINDINGS: Brain: No acute intracranial findings are seen. There are no signs of bleeding within the cranium. Ventricles are not dilated. There is no focal edema or mass effect. Vascular: Unremarkable. Skull: No acute findings are seen. Sinuses/Orbits:  Unremarkable. Other: None. IMPRESSION: No acute intracranial findings are seen in noncontrast CT brain. Electronically Signed   By: Ernie Avena M.D.   On: 09/12/2022 18:52   DG Chest Port 1 View  Result Date: 09/12/2022 CLINICAL DATA:  Anemia, tachypnea, dizziness, headache, nausea EXAM: PORTABLE CHEST 1 VIEW COMPARISON:  01/31/2019 FINDINGS: Cardiomegaly. Pulmonary vascular prominence and diffuse bilateral interstitial pulmonary opacity. The visualized skeletal structures are unremarkable. IMPRESSION: Cardiomegaly with pulmonary vascular prominence and diffuse bilateral interstitial pulmonary opacity, likely edema. No focal airspace opacity. Electronically Signed   By: Jearld Lesch M.D.   On: 09/12/2022 15:20    ASSESSMENT & PLAN Nicole Hurley is a 37 y.o. female [facility] for consultation regarding axillary lymphadenopathy and adrenal mass. We have reviewed etiologies including infectious process, inflammatory process, lymphoproliferative disorder, malignancy, benign mass. Patient will proceed with laboratory workup today.   #Bilateral axillary adenopathy -No palpable LAD on exam. - Labs collected today include CBC, CMP, ESR, CRP, LDH, flow cytometry. -CTA chest on 07/11 with this finding, unsure of chronicity. Will plan to get bilateral US of axilla in x 1 month to reassess. If still present would consider biopsy for further evaluation.    #Left adrenal mass -As patient is being worked up for there axillary adenopathy it is reasonable to work up the incidental finding of adrenal mass. -Patient will need MR abdomen for further evaluation. As she had feraheme infusion in June will plan on MR at the end of August. (3 months from infusion)  #Anemia -New although patient did not receive medical care for several years so unsure of chronicity. -CBC today with hemoglobin 8.4, no transfusion indicated.  -Iron studies ordered. Patient will continue PO iron  as long as she tolerates it.  -Patient will RTC when work up is complete.  Patient expressed understanding of the recommended workup and is agreeable to move forward.   All questions were answered. The patient knows to call the clinic with any problems, questions or concerns.  Shared visit with Dr. Leonides Schanz.  Orders Placed This Encounter  Procedures   [place] AXILLA RIGHT    Standing Status:   Future    Standing Expiration Date:   10/07/2023    Order Specific Question:   Reason for Exam (SYMPTOM  OR DIAGNOSIS REQUIRED)    Answer:   bilateral axillary lymphadenopathy seen on CTA chest 7/11. Recheck LAD with [place]    Order Specific Question:   Preferred imaging location?    Answer:   [facility]   [place] AXILLA LEFT    Standing Status:   Future    Standing Expiration Date:   10/07/2023    Order Specific Question:   Reason for Exam (SYMPTOM  OR DIAGNOSIS REQUIRED)    Answer:   bilateral axillary lymphadenopathy seen on CTA chest 7/11. Recheck LAD with [place]    Order Specific Question:   Preferred imaging location?    Answer:   [facility]   MR Abdomen W Wo Contrast    Patient received IV feraheme on 09/13/2022. Please be aware.    Standing Status:   Future    Standing Expiration Date:   10/06/2023    Order Specific Question:  If indicated for the ordered procedure, I authorize the administration of contrast media per Radiology protocol    Answer:   Yes    Order Specific Question:   What is the patient's sedation requirement?    Answer:   No Sedation    Order Specific Question:   Does the patient have a pacemaker or implanted devices?    Answer:   No    Order Specific Question:   Preferred imaging location?    Answer:   [facility] (table limit - 550 lbs)   CBC with Differential (Cancer Center Only)    Standing Status:   Future    Number of Occurrences:   1    Standing Expiration Date:   10/06/2023   CMP (Cancer Center only)    Standing Status:   Future    Number of  Occurrences:   1    Standing Expiration Date:   10/06/2023   Lactate dehydrogenase (LDH)    Standing Status:   Future    Number of Occurrences:   1    Standing Expiration Date:   10/06/2023   Ferritin    Standing Status:   Future    Number of Occurrences:   1    Standing Expiration Date:   10/06/2023   Iron and Iron Binding Capacity ([facility] only)    Standing Status:   Future    Number of Occurrences:   1    Standing Expiration Date:   10/06/2023   Sedimentation rate    Standing Status:   Future    Number of Occurrences:   1    Standing Expiration Date:   10/06/2023   C-reactive protein    Standing Status:   Future    Number of Occurrences:   1    Standing Expiration Date:   10/06/2023   Flow Cytometry, Peripheral Blood (Oncology)    Standing Status:   Future    Number of Occurrences:   1    Standing Expiration Date:   10/06/2023   Sample to Blood Bank    Standing Status:   Future    Number of Occurrences:   1    Standing Expiration Date:   10/06/2023   Type and screen         Standing Status:   Future    Standing Expiration Date:   10/06/2023   ABO/RH    Standing Status:   Future    Number of Occurrences:   1    Standing Expiration Date:   10/06/2023      I have spent a total of 60 minutes minutes of face-to-face and non-face-to-face time, preparing to see the patient, obtaining and/or reviewing separately obtained history, performing a medically appropriate examination, counseling and educating the patient, ordering medications/tests/procedures, referring and communicating with other health care professionals, documenting clinical information in the electronic health record, independently interpreting results and communicating results to the patient, and care coordination.   Namon Cirri PA-C Department of Hematology/Oncology [facility] at [facility] Phone: [phone]  I have read the above note and personally examined the patient. I  agree with the assessment and plan as noted above.  Briefly Mrs. Lanett Lasorsa is a 37 year old female who presents with multiple issues.  She has iron deficiency anemia secondary to heavy menstrual bleeding, a adrenal lesion and hepatic lesion, and bilateral axillary lymphadenopathy.  In terms of iron deficiency the patient has received IV iron therapy, most recently on 10/03/2022.  We will repeat her iron studies today to determine if she needs additional IV iron therapy.  Additionally the patient has an adrenal lesion hepatic lesion, and has been referred to endocrinology.  Most recent imaging does request an MRI for for clear imaging which we will order.  And finally in terms of the patient's bilateral lymphadenopathy in the axilla is we will order ESR, CRP, CBC, CMP, and flow cytometry.  In the event that these lesions are persistent (we will order ultrasound in 1 months time) we will consider biopsy of the lesion in addition to hepatitis B, C, HIV testing.  The patient voiced understanding of our findings and the plan moving forward.   Ulysees Barns, MD Department of Hematology/Oncology [facility] at [facility] Phone: [phone] Pager: [phone] Email: [email or web address]

## 2022-10-06 ENCOUNTER — Inpatient Hospital Stay: Payer: Medicaid Other | Attending: Physician Assistant

## 2022-10-06 ENCOUNTER — Inpatient Hospital Stay: Payer: Medicaid Other | Admitting: Physician Assistant

## 2022-10-06 VITALS — BP 135/86 | HR 88 | Temp 98.0°F | Resp 20 | Wt >= 6400 oz

## 2022-10-06 DIAGNOSIS — R590 Localized enlarged lymph nodes: Secondary | ICD-10-CM | POA: Insufficient documentation

## 2022-10-06 DIAGNOSIS — R591 Generalized enlarged lymph nodes: Secondary | ICD-10-CM

## 2022-10-06 DIAGNOSIS — I10 Essential (primary) hypertension: Secondary | ICD-10-CM | POA: Diagnosis not present

## 2022-10-06 DIAGNOSIS — Z87891 Personal history of nicotine dependence: Secondary | ICD-10-CM | POA: Insufficient documentation

## 2022-10-06 DIAGNOSIS — E278 Other specified disorders of adrenal gland: Secondary | ICD-10-CM

## 2022-10-06 DIAGNOSIS — F419 Anxiety disorder, unspecified: Secondary | ICD-10-CM | POA: Insufficient documentation

## 2022-10-06 DIAGNOSIS — D500 Iron deficiency anemia secondary to blood loss (chronic): Secondary | ICD-10-CM

## 2022-10-06 LAB — CBC WITH DIFFERENTIAL (CANCER CENTER ONLY)
Abs Immature Granulocytes: 0.04 10*3/uL (ref 0.00–0.07)
Basophils Absolute: 0 10*3/uL (ref 0.0–0.1)
Basophils Relative: 0 %
Eosinophils Absolute: 0.3 10*3/uL (ref 0.0–0.5)
Eosinophils Relative: 3 %
HCT: 31 % — ABNORMAL LOW (ref 36.0–46.0)
Hemoglobin: 8.4 g/dL — ABNORMAL LOW (ref 12.0–15.0)
Immature Granulocytes: 0 %
Lymphocytes Relative: 21 %
Lymphs Abs: 2.3 10*3/uL (ref 0.7–4.0)
MCH: 19.8 pg — ABNORMAL LOW (ref 26.0–34.0)
MCHC: 27.1 g/dL — ABNORMAL LOW (ref 30.0–36.0)
MCV: 73.1 fL — ABNORMAL LOW (ref 80.0–100.0)
Monocytes Absolute: 0.5 10*3/uL (ref 0.1–1.0)
Monocytes Relative: 4 %
Neutro Abs: 7.7 10*3/uL (ref 1.7–7.7)
Neutrophils Relative %: 72 %
Platelet Count: 398 10*3/uL (ref 150–400)
RBC: 4.24 MIL/uL (ref 3.87–5.11)
RDW: 27.7 % — ABNORMAL HIGH (ref 11.5–15.5)
WBC Count: 10.8 10*3/uL — ABNORMAL HIGH (ref 4.0–10.5)
nRBC: 0.3 % — ABNORMAL HIGH (ref 0.0–0.2)

## 2022-10-06 LAB — IRON AND IRON BINDING CAPACITY (CC-WL,HP ONLY)
Iron: 26 ug/dL — ABNORMAL LOW (ref 28–170)
Saturation Ratios: 6 % — ABNORMAL LOW (ref 10.4–31.8)
TIBC: 412 ug/dL (ref 250–450)
UIBC: 386 ug/dL (ref 148–442)

## 2022-10-06 LAB — CMP (CANCER CENTER ONLY)
ALT: 11 U/L (ref 0–44)
AST: 12 U/L — ABNORMAL LOW (ref 15–41)
Albumin: 3.4 g/dL — ABNORMAL LOW (ref 3.5–5.0)
Alkaline Phosphatase: 86 U/L (ref 38–126)
Anion gap: 6 (ref 5–15)
BUN: 8 mg/dL (ref 6–20)
CO2: 28 mmol/L (ref 22–32)
Calcium: 8.9 mg/dL (ref 8.9–10.3)
Chloride: 103 mmol/L (ref 98–111)
Creatinine: 0.45 mg/dL (ref 0.44–1.00)
GFR, Estimated: >60 mL/min (ref >60)
Glucose, Bld: 136 mg/dL — ABNORMAL HIGH (ref 70–99)
Potassium: 3.6 mmol/L (ref 3.5–5.1)
Sodium: 137 mmol/L (ref 135–145)
Total Bilirubin: 0.2 mg/dL — ABNORMAL LOW (ref 0.3–1.2)
Total Protein: 8.7 g/dL — ABNORMAL HIGH (ref 6.5–8.1)

## 2022-10-06 LAB — LACTATE DEHYDROGENASE: LDH: 148 U/L (ref 98–192)

## 2022-10-06 LAB — SEDIMENTATION RATE: Sed Rate: 57 mm/hr — ABNORMAL HIGH (ref 0–22)

## 2022-10-06 LAB — ABO/RH: ABO/RH(D): A POS

## 2022-10-06 LAB — SAMPLE TO BLOOD BANK

## 2022-10-06 LAB — C-REACTIVE PROTEIN: CRP: 6.7 mg/dL — ABNORMAL HIGH (ref <1.0)

## 2022-10-07 ENCOUNTER — Encounter: Payer: Self-pay | Admitting: Physician Assistant

## 2022-10-07 LAB — SURGICAL PATHOLOGY

## 2022-10-07 LAB — FERRITIN: Ferritin: 30 ng/mL (ref 11–307)

## 2022-10-08 ENCOUNTER — Encounter: Payer: Self-pay | Admitting: Physician Assistant

## 2022-10-08 ENCOUNTER — Other Ambulatory Visit: Payer: Self-pay | Admitting: Physician Assistant

## 2022-10-08 DIAGNOSIS — R899 Unspecified abnormal finding in specimens from other organs, systems and tissues: Secondary | ICD-10-CM

## 2022-10-08 LAB — FLOW CYTOMETRY

## 2022-10-08 NOTE — Progress Notes (Signed)
Referral sent to [facility] for further evaluation of inflammatory markers. New finding during [facility] evaluation. Patient made aware of plan.

## 2022-10-09 ENCOUNTER — Telehealth: Payer: Self-pay

## 2022-10-09 NOTE — Telephone Encounter (Signed)
This RN attempted to call patient regarding scheduled rheumatology appointment, no answer and voicemail full.

## 2022-10-10 ENCOUNTER — Telehealth: Payer: Self-pay | Admitting: Hematology and Oncology

## 2022-10-10 ENCOUNTER — Other Ambulatory Visit: Payer: Self-pay | Admitting: Hematology and Oncology

## 2022-10-13 ENCOUNTER — Telehealth: Payer: Self-pay | Admitting: Pharmacy Technician

## 2022-10-13 NOTE — Telephone Encounter (Signed)
Auth Submission: NO AUTH NEEDED Site of care: Site of care: [facility] Payer: [place] medicaid healthyblue Medication & CPT/J Code(s) submitted: Monoferric (Ferrci derisomaltose) [phone] Route of submission (phone, fax, portal):  Phone # Fax # Auth type: Buy/Bill Units/visits requested: 1 Reference number:  Approval from: 10/13/22 to 02/13/23

## 2022-10-15 ENCOUNTER — Encounter: Payer: Medicaid Other | Admitting: Obstetrics & Gynecology

## 2022-10-16 ENCOUNTER — Ambulatory Visit (INDEPENDENT_AMBULATORY_CARE_PROVIDER_SITE_OTHER): Payer: Medicaid Other

## 2022-10-16 VITALS — BP 124/88 | HR 92 | Temp 98.6°F | Resp 18 | Ht 66.0 in | Wt >= 6400 oz

## 2022-10-16 DIAGNOSIS — D500 Iron deficiency anemia secondary to blood loss (chronic): Secondary | ICD-10-CM | POA: Diagnosis not present

## 2022-10-16 DIAGNOSIS — N920 Excessive and frequent menstruation with regular cycle: Secondary | ICD-10-CM

## 2022-10-16 MED ORDER — SODIUM CHLORIDE 0.9 % IV SOLN
1000.0000 mg | Freq: Once | INTRAVENOUS | Status: AC
  Administered 2022-10-16: 1000 mg via INTRAVENOUS
  Filled 2022-10-16: qty 10

## 2022-10-16 NOTE — Patient Instructions (Signed)

## 2022-10-16 NOTE — Progress Notes (Signed)
Diagnosis: Iron Deficiency Anemia  Provider:  Chilton Greathouse MD  Procedure: IV Infusion  IV Type: Peripheral, IV Location: L Forearm  Monoferric (Ferric Derisomaltose), Dose: 1000 mg  Infusion Start Time: 1510  Infusion Stop Time: 1537  Post Infusion IV Care: Observation period completed and Peripheral IV Discontinued  Discharge: Condition: Good, Destination: Home . AVS Provided  Performed by:  Adriana Mccallum, RN

## 2022-11-20 ENCOUNTER — Telehealth: Payer: Self-pay

## 2022-11-20 NOTE — Telephone Encounter (Signed)
RN called patient regarding MRI and [place] not being scheduled yet- no answer, voicemail left. Patient was given the central scheduling number to set up appointments and also [facility] number in case she needs assistance.

## 2022-11-26 ENCOUNTER — Inpatient Hospital Stay: Payer: Medicaid Other | Attending: Physician Assistant

## 2022-12-01 ENCOUNTER — Telehealth: Payer: Self-pay | Admitting: Hematology and Oncology

## 2022-12-03 ENCOUNTER — Other Ambulatory Visit: Payer: Self-pay | Admitting: Hematology and Oncology

## 2022-12-03 ENCOUNTER — Inpatient Hospital Stay: Payer: Medicaid Other | Admitting: Hematology and Oncology

## 2022-12-03 ENCOUNTER — Telehealth: Payer: Self-pay | Admitting: *Deleted

## 2022-12-03 ENCOUNTER — Inpatient Hospital Stay: Payer: Medicaid Other

## 2022-12-03 DIAGNOSIS — D500 Iron deficiency anemia secondary to blood loss (chronic): Secondary | ICD-10-CM

## 2022-12-03 DIAGNOSIS — R591 Generalized enlarged lymph nodes: Secondary | ICD-10-CM

## 2022-12-03 NOTE — Progress Notes (Deleted)
[facility] [phone]   Fax:([phone]  PROGRESS NOTE  Patient Care Team: Center, [facility] as PCP - General (Hematology and Oncology)  Hematological/Oncological History # ***  Interval History:  Nicole Hurley 37 y.o. female with medical history significant for *** presents for a follow up visit. The patient's last visit was on ***. In the interim since the last visit ***  MEDICAL HISTORY:  Past Medical History:  Diagnosis Date   Gallstones 09/2017   Hypertension     SURGICAL HISTORY: Past Surgical History:  Procedure Laterality Date   CESAREAN SECTION     CHOLECYSTECTOMY N/A 10/22/2017   Procedure: LAPAROSCOPIC CHOLECYSTECTOMY WITH POSSIBLE INTRAOPERATIVE CHOLANGIOGRAM;  Surgeon: Harriette Bouillon, MD;  Location: [facility] OR;  Service: General;  Laterality: N/A;   DENTAL SURGERY      SOCIAL HISTORY: Social History   Socioeconomic History   Marital status: Single    Spouse name: Not on file   Number of children: Not on file   Years of education: Not on file   Highest education level: Not on file  Occupational History   Not on file  Tobacco Use   Smoking status: Former    Types: Cigarettes   Smokeless tobacco: Never  Vaping Use   Vaping status: Never Used  Substance and Sexual Activity   Alcohol use: Yes   Drug use: No   Sexual activity: Not on file  Other Topics Concern   Not on file  Social History Narrative   Not on file   Social Determinants of Health   Financial Resource Strain: Not on file  Food Insecurity: Food Insecurity Present (10/02/2022)   Hunger Vital Sign    Worried About Running Out of Food in the Last Year: Sometimes true    Ran Out of Food in the Last Year: Sometimes true  Transportation Needs: No Transportation Needs (10/02/2022)   PRAPARE - Administrator, Civil Service (Medical): No    Lack of Transportation (Non-Medical): No  Physical Activity: Not on file  Stress: Not on file   Social Connections: Not on file  Intimate Partner Violence: Not At Risk (10/02/2022)   Humiliation, Afraid, Rape, and Kick questionnaire    Fear of Current or Ex-Partner: No    Emotionally Abused: No    Physically Abused: No    Sexually Abused: No    FAMILY HISTORY: Family History  Problem Relation Age of Onset   Healthy Mother    Healthy Father     ALLERGIES:  is allergic to benadryl [diphenhydramine hcl (sleep)].  MEDICATIONS:  Current Outpatient Medications  Medication Sig Dispense Refill   acetaminophen (TYLENOL) 500 MG tablet Take 500 mg by mouth every 6 (six) hours as needed for moderate pain.     albuterol (VENTOLIN HFA) 108 (90 Base) MCG/ACT inhaler Inhale 1-2 puffs into the lungs every 6 (six) hours as needed for wheezing or shortness of breath. 18 g 0   ferrous sulfate 325 (65 FE) MG tablet Take 1 tablet (325 mg total) by mouth every other day. 100 tablet 0   No current facility-administered medications for this visit.    REVIEW OF SYSTEMS:   Constitutional: ( - ) fevers, ( - )  chills , ( - ) night sweats Eyes: ( - ) blurriness of vision, ( - ) double vision, ( - ) watery eyes Ears, nose, mouth, throat, and face: ( - ) mucositis, ( - ) sore throat Respiratory: ( - ) cough, ( - )  dyspnea, ( - ) wheezes Cardiovascular: ( - ) palpitation, ( - ) chest discomfort, ( - ) lower extremity swelling Gastrointestinal:  ( - ) nausea, ( - ) heartburn, ( - ) change in bowel habits Skin: ( - ) abnormal skin rashes Lymphatics: ( - ) new lymphadenopathy, ( - ) easy bruising Neurological: ( - ) numbness, ( - ) tingling, ( - ) new weaknesses Behavioral/Psych: ( - ) mood change, ( - ) new changes  All other systems were reviewed with the patient and are negative.  PHYSICAL EXAMINATION: ECOG PERFORMANCE STATUS: {CHL ONC ECOG [phone]}  There were no vitals filed for this visit. There were no vitals filed for this visit.  GENERAL: alert, no distress and comfortable SKIN:  skin color, texture, turgor are normal, no rashes or significant lesions EYES: conjunctiva are pink and non-injected, sclera clear OROPHARYNX: no exudate, no erythema; lips, buccal mucosa, and tongue normal  NECK: supple, non-tender LYMPH:  no palpable lymphadenopathy in the cervical, axillary or inguinal LUNGS: clear to auscultation and percussion with normal breathing effort HEART: regular rate & rhythm and no murmurs and no lower extremity edema ABDOMEN: soft, non-tender, non-distended, normal bowel sounds Musculoskeletal: no cyanosis of digits and no clubbing  PSYCH: alert & oriented x 3, fluent speech NEURO: no focal motor/sensory deficits  LABORATORY DATA:  I have reviewed the data as listed    Latest Ref Rng & Units 10/06/2022    2:44 PM 10/03/2022    7:17 AM 10/02/2022    4:12 PM  CBC  WBC 4.0 - 10.5 K/uL 10.8  7.3  8.6   Hemoglobin 12.0 - 15.0 g/dL 8.4  7.6  8.1   Hematocrit 36.0 - 46.0 % 31.0  28.3  30.2   Platelets 150 - 400 K/uL 398  375  416        Latest Ref Rng & Units 10/06/2022    2:44 PM 10/03/2022    7:17 AM 10/02/2022    4:12 PM  CMP  Glucose 70 - 99 mg/dL 595  638  756   BUN 6 - 20 mg/dL 8  6  6    Creatinine 0.44 - 1.00 mg/dL 4.33  2.95  1.88   Sodium 135 - 145 mmol/L 137  135  139   Potassium 3.5 - 5.1 mmol/L 3.6  3.8  3.5   Chloride 98 - 111 mmol/L 103  101  102   CO2 22 - 32 mmol/L 28  26  23    Calcium 8.9 - 10.3 mg/dL 8.9  8.3  8.6   Total Protein 6.5 - 8.1 g/dL 8.7     Total Bilirubin 0.3 - 1.2 mg/dL 0.2     Alkaline Phos 38 - 126 U/L 86     AST 15 - 41 U/L 12     ALT 0 - 44 U/L 11       No results found for: "MPROTEIN" No results found for: "KPAFRELGTCHN", "LAMBDASER", "KAPLAMBRATIO"   BLOOD FILM: *** Review of the peripheral blood smear showed normal appearing white cells with neutrophils that were appropriately lobated and granulated. There was no predominance of bi-lobed or hyper-segmented neutrophils appreciated. No Dohle bodies were noted.  There was no left shifting, immature forms or blasts noted. Lymphocytes remain normal in size without any predominance of large granular lymphocytes. Red cells show no anisopoikilocytosis, macrocytes , microcytes or polychromasia. There were no schistocytes, target cells, echinocytes, acanthocytes, dacrocytes, or stomatocytes.There was no rouleaux formation, nucleated red cells, or intra-cellular  inclusions noted. The platelets are normal in size, shape, and color without any clumping evident.  RADIOGRAPHIC STUDIES: I have personally reviewed the radiological images as listed and agreed with the findings in the report. No results found.  ASSESSMENT & PLAN ***  No orders of the defined types were placed in this encounter.   All questions were answered. The patient knows to call the clinic with any problems, questions or concerns.  A total of more than 30 minutes were spent on this encounter with face-to-face time and non-face-to-face time, including preparing to see the patient, ordering tests and/or medications, counseling the patient and coordination of care as outlined above.   Ulysees Barns, MD Department of Hematology/Oncology [facility] Phone: [phone] Pager: [phone] Email: [email or web address]  12/03/2022 7:39 AM

## 2022-12-03 NOTE — Telephone Encounter (Signed)
PC to patient regarding missed appointment, no answer, left VM - informed patient our scheduling department will contact her to reschedule.  Instructed patient to contact this office with any questions/concerns, [phone].  Per Dr Leonides Schanz, patient to see Karena Addison PA in four weeks for labs & visit, scheduling message sent.

## 2022-12-04 ENCOUNTER — Telehealth: Payer: Self-pay | Admitting: Hematology and Oncology

## 2022-12-17 ENCOUNTER — Telehealth: Payer: Self-pay | Admitting: Physician Assistant

## 2022-12-17 NOTE — Telephone Encounter (Signed)
Attempted to contact patient to discuss missed [place] and MRI abdomen. Patient has been unable to be reached since her initial [facility] visit on 10/06/22. Will attempt again tomorrow. If not able to reach the patient will send a certified letter.

## 2022-12-31 ENCOUNTER — Inpatient Hospital Stay: Payer: Medicaid Other | Attending: Physician Assistant

## 2022-12-31 ENCOUNTER — Other Ambulatory Visit: Payer: Self-pay | Admitting: Physician Assistant

## 2022-12-31 ENCOUNTER — Inpatient Hospital Stay: Payer: Medicaid Other | Admitting: Physician Assistant

## 2022-12-31 DIAGNOSIS — D500 Iron deficiency anemia secondary to blood loss (chronic): Secondary | ICD-10-CM

## 2023-01-02 ENCOUNTER — Telehealth: Payer: Self-pay | Admitting: Physician Assistant

## 2023-01-02 NOTE — Telephone Encounter (Signed)
Contacted patient's mother who is listed as her Emergency Contact after not being able to reach patient. Multiple attempts made included phone calls and MyChart messages. Mother will ask patient to return our call. If we are notable to reach patient then a certified letter will be sent.

## 2023-01-12 NOTE — Addendum Note (Signed)
Addended by: Ihor Austin D on: 01/12/2023 10:58 AM   Modules accepted: Orders

## 2023-01-16 ENCOUNTER — Telehealth: Payer: Self-pay | Admitting: Hematology and Oncology

## 2023-02-09 ENCOUNTER — Inpatient Hospital Stay: Payer: Medicaid Other | Attending: Physician Assistant

## 2023-02-09 ENCOUNTER — Inpatient Hospital Stay: Payer: Medicaid Other | Admitting: Hematology and Oncology

## 2023-02-09 NOTE — Progress Notes (Deleted)
[facility] [phone]   Fax:(336) [phone]  PROGRESS NOTE  Patient Care Team: Center, [facility] Medical as PCP - General (Hematology and Oncology)  Hematological/Oncological History # ***  Interval History:  Nicole Hurley 37 y.o. female with medical history significant for *** presents for a follow up visit. The patient's last visit was on ***. In the interim since the last visit ***  MEDICAL HISTORY:  Past Medical History:  Diagnosis Date   Gallstones 09/2017   Hypertension     SURGICAL HISTORY: Past Surgical History:  Procedure Laterality Date   CESAREAN SECTION     CHOLECYSTECTOMY N/A 10/22/2017   Procedure: LAPAROSCOPIC CHOLECYSTECTOMY WITH POSSIBLE INTRAOPERATIVE CHOLANGIOGRAM;  Surgeon: Harriette Bouillon, MD;  Location: [facility] OR;  Service: General;  Laterality: N/A;   DENTAL SURGERY      SOCIAL HISTORY: Social History   Socioeconomic History   Marital status: Single    Spouse name: Not on file   Number of children: Not on file   Years of education: Not on file   Highest education level: Not on file  Occupational History   Not on file  Tobacco Use   Smoking status: Former    Types: Cigarettes   Smokeless tobacco: Never  Vaping Use   Vaping status: Never Used  Substance and Sexual Activity   Alcohol use: Yes   Drug use: No   Sexual activity: Not on file  Other Topics Concern   Not on file  Social History Narrative   Not on file   Social Determinants of Health   Financial Resource Strain: Not on file  Food Insecurity: Food Insecurity Present (10/02/2022)   Hunger Vital Sign    Worried About Running Out of Food in the Last Year: Sometimes true    Ran Out of Food in the Last Year: Sometimes true  Transportation Needs: No Transportation Needs (10/02/2022)   PRAPARE - Administrator, Civil Service (Medical): No    Lack of Transportation (Non-Medical): No  Physical Activity: Not on file  Stress: Not on file   Social Connections: Not on file  Intimate Partner Violence: Not At Risk (10/02/2022)   Humiliation, Afraid, Rape, and Kick questionnaire    Fear of Current or Ex-Partner: No    Emotionally Abused: No    Physically Abused: No    Sexually Abused: No    FAMILY HISTORY: Family History  Problem Relation Age of Onset   Healthy Mother    Healthy Father     ALLERGIES:  is allergic to benadryl [diphenhydramine hcl (sleep)].  MEDICATIONS:  Current Outpatient Medications  Medication Sig Dispense Refill   acetaminophen (TYLENOL) 500 MG tablet Take 500 mg by mouth every 6 (six) hours as needed for moderate pain.     albuterol (VENTOLIN HFA) 108 (90 Base) MCG/ACT inhaler Inhale 1-2 puffs into the lungs every 6 (six) hours as needed for wheezing or shortness of breath. 18 g 0   ferrous sulfate 325 (65 FE) MG tablet Take 1 tablet (325 mg total) by mouth every other day. 100 tablet 0   No current facility-administered medications for this visit.    REVIEW OF SYSTEMS:   Constitutional: ( - ) fevers, ( - )  chills , ( - ) night sweats Eyes: ( - ) blurriness of vision, ( - ) double vision, ( - ) watery eyes Ears, nose, mouth, throat, and face: ( - ) mucositis, ( - ) sore throat Respiratory: ( - ) cough, ( - )  dyspnea, ( - ) wheezes Cardiovascular: ( - ) palpitation, ( - ) chest discomfort, ( - ) lower extremity swelling Gastrointestinal:  ( - ) nausea, ( - ) heartburn, ( - ) change in bowel habits Skin: ( - ) abnormal skin rashes Lymphatics: ( - ) new lymphadenopathy, ( - ) easy bruising Neurological: ( - ) numbness, ( - ) tingling, ( - ) new weaknesses Behavioral/Psych: ( - ) mood change, ( - ) new changes  All other systems were reviewed with the patient and are negative.  PHYSICAL EXAMINATION: ECOG PERFORMANCE STATUS: {CHL ONC ECOG PS:[phone]}  There were no vitals filed for this visit. There were no vitals filed for this visit.  GENERAL: alert, no distress and comfortable SKIN:  skin color, texture, turgor are normal, no rashes or significant lesions EYES: conjunctiva are pink and non-injected, sclera clear OROPHARYNX: no exudate, no erythema; lips, buccal mucosa, and tongue normal  NECK: supple, non-tender LYMPH:  no palpable lymphadenopathy in the cervical, axillary or inguinal LUNGS: clear to auscultation and percussion with normal breathing effort HEART: regular rate & rhythm and no murmurs and no lower extremity edema ABDOMEN: soft, non-tender, non-distended, normal bowel sounds Musculoskeletal: no cyanosis of digits and no clubbing  PSYCH: alert & oriented x 3, fluent speech NEURO: no focal motor/sensory deficits  LABORATORY DATA:  I have reviewed the data as listed    Latest Ref Rng & Units 10/06/2022    2:44 PM 10/03/2022    7:17 AM 10/02/2022    4:12 PM  CBC  WBC 4.0 - 10.5 K/uL 10.8  7.3  8.6   Hemoglobin 12.0 - 15.0 g/dL 8.4  7.6  8.1   Hematocrit 36.0 - 46.0 % 31.0  28.3  30.2   Platelets 150 - 400 K/uL 398  375  416        Latest Ref Rng & Units 10/06/2022    2:44 PM 10/03/2022    7:17 AM 10/02/2022    4:12 PM  CMP  Glucose 70 - 99 mg/dL 161  096  045   BUN 6 - 20 mg/dL 8  6  6    Creatinine 0.44 - 1.00 mg/dL 4.09  8.11  9.14   Sodium 135 - 145 mmol/L 137  135  139   Potassium 3.5 - 5.1 mmol/L 3.6  3.8  3.5   Chloride 98 - 111 mmol/L 103  101  102   CO2 22 - 32 mmol/L 28  26  23    Calcium 8.9 - 10.3 mg/dL 8.9  8.3  8.6   Total Protein 6.5 - 8.1 g/dL 8.7     Total Bilirubin 0.3 - 1.2 mg/dL 0.2     Alkaline Phos 38 - 126 U/L 86     AST 15 - 41 U/L 12     ALT 0 - 44 U/L 11       No results found for: "MPROTEIN" No results found for: "KPAFRELGTCHN", "LAMBDASER", "KAPLAMBRATIO"   BLOOD FILM: *** Review of the peripheral blood smear showed normal appearing white cells with neutrophils that were appropriately lobated and granulated. There was no predominance of bi-lobed or hyper-segmented neutrophils appreciated. No Dohle bodies were noted.  There was no left shifting, immature forms or blasts noted. Lymphocytes remain normal in size without any predominance of large granular lymphocytes. Red cells show no anisopoikilocytosis, macrocytes , microcytes or polychromasia. There were no schistocytes, target cells, echinocytes, acanthocytes, dacrocytes, or stomatocytes.There was no rouleaux formation, nucleated red cells, or intra-cellular  inclusions noted. The platelets are normal in size, shape, and color without any clumping evident.  RADIOGRAPHIC STUDIES: I have personally reviewed the radiological images as listed and agreed with the findings in the report. No results found.  ASSESSMENT & PLAN ***  No orders of the defined types were placed in this encounter.   All questions were answered. The patient knows to call the clinic with any problems, questions or concerns.  A total of more than 30 minutes were spent on this encounter with face-to-face time and non-face-to-face time, including preparing to see the patient, ordering tests and/or medications, counseling the patient and coordination of care as outlined above.   Ulysees Barns, MD Department of Hematology/Oncology [facility] at [facility] Phone: [phone] Pager: [phone] Email: [email or web address]  02/09/2023 7:46 AM

## 2023-03-08 NOTE — Progress Notes (Unsigned)
Office Visit Note  Patient: Nicole Hurley             Date of Birth: 1985/10/06           MRN: [id number]             PCP: [facility] Referring: Shanon Ace,* Visit Date: 03/09/2023 Occupation: @GUAROCC @  Subjective:  No chief complaint on file.   History of Present Illness: Nicole Hurley is a 37 y.o. female here for evaluation of elevated inflammatory markers. Originally checked in association with dyspnea symptoms and symptomatic anemia, with lymphadenopathy and abdominal mass consistent for adrenal adenoma described on imaging.***   Labs reviewed 09/2022 ESR 57 CRP 6.7 Iron 26 Sat 6% TIBC 412 Ferritin 30  10/06/22 Flow cytometry with no abnormal T cell population  10/03/22 CT Abdomen W and W/O Contrast IMPRESSION: 1. Enlargement of the LEFT adrenal gland is new from comparison exams. Favor benign adenoma however cannot be characterized as such on current exam. Recommend MRI adrenal protocol for further evaluation. 2. Interval enlargement of benign-appearing lobular soft tissue mass anterior to the LEFT hepatic lobe. Mass has increased significantly in size from 2017. Mass could be included in imaging of the adrenal glands recommended above.  10/02/22 CTA Chest IMPRESSION: 1. Markedly limited evaluation of the pulmonary arteries secondary to suboptimal opacification with intravenous contrast. 2. Moderate severity bilateral axillary lymphadenopathy. 3. 18 mm diameter low-attenuation left adrenal mass, likely consistent with an adrenal adenoma. Correlation with nonemergent adrenal protocol CT is recommended. This recommendation follows ACR consensus guidelines: Management of Incidental Adrenal Masses: A White Paper of the ACR Incidental Findings Committee. J Am Coll Radiol 2017;14:1038-1044.  Activities of Daily Living:  Patient reports morning stiffness for *** {minute/hour:19697}.   Patient {ACTIONS;DENIES/REPORTS:21021675::"Denies"}  nocturnal pain.  Difficulty dressing/grooming: {ACTIONS;DENIES/REPORTS:21021675::"Denies"} Difficulty climbing stairs: {ACTIONS;DENIES/REPORTS:21021675::"Denies"} Difficulty getting out of chair: {ACTIONS;DENIES/REPORTS:21021675::"Denies"} Difficulty using hands for taps, buttons, cutlery, and/or writing: {ACTIONS;DENIES/REPORTS:21021675::"Denies"}  No Rheumatology ROS completed.   PMFS History:  Patient Active Problem List   Diagnosis Date Noted   Acute respiratory failure with hypoxia (HCC) 10/02/2022   Left adrenal mass (HCC) 10/02/2022   Axillary adenopathy 10/02/2022   Chest pain 10/02/2022   Iron deficiency anemia due to chronic blood loss 09/12/2022   Cholecystitis 10/21/2017    Past Medical History:  Diagnosis Date   Gallstones 09/2017   Hypertension     Family History  Problem Relation Age of Onset   Healthy Mother    Healthy Father    Past Surgical History:  Procedure Laterality Date   CESAREAN SECTION     CHOLECYSTECTOMY N/A 10/22/2017   Procedure: LAPAROSCOPIC CHOLECYSTECTOMY WITH POSSIBLE INTRAOPERATIVE CHOLANGIOGRAM;  Surgeon: Harriette Bouillon, MD;  Location: [facility] OR;  Service: General;  Laterality: N/A;   DENTAL SURGERY     Social History   Social History Narrative   Not on file    There is no immunization history on file for this patient.   Objective: Vital Signs: There were no vitals taken for this visit.   Physical Exam   Musculoskeletal Exam: ***  CDAI Exam: CDAI Score: -- Patient Global: --; Provider Global: -- Swollen: --; Tender: -- Joint Exam 03/09/2023   No joint exam has been documented for this visit   There is currently no information documented on the homunculus. Go to the Rheumatology activity and complete the homunculus joint exam.  Investigation: No additional findings.  Imaging: No results found.  Recent Labs: Lab Results  Component Value Date  WBC 10.8 (H) 10/06/2022   HGB 8.4 (L) 10/06/2022   PLT 398 10/06/2022   NA  137 10/06/2022   K 3.6 10/06/2022   CL 103 10/06/2022   CO2 28 10/06/2022   GLUCOSE 136 (H) 10/06/2022   BUN 8 10/06/2022   CREATININE 0.45 10/06/2022   BILITOT 0.2 (L) 10/06/2022   ALKPHOS 86 10/06/2022   AST 12 (L) 10/06/2022   ALT 11 10/06/2022   PROT 8.7 (H) 10/06/2022   ALBUMIN 3.4 (L) 10/06/2022   CALCIUM 8.9 10/06/2022   GFRAA >60 12/25/2018    Speciality Comments: No specialty comments available.  Procedures:  No procedures performed Allergies: Benadryl [diphenhydramine hcl (sleep)]   Assessment / Plan:     Visit Diagnoses: No diagnosis found.  Orders: No orders of the defined types were placed in this encounter.  No orders of the defined types were placed in this encounter.   Face-to-face time spent with patient was *** minutes. Greater than 50% of time was spent in counseling and coordination of care.  Follow-Up Instructions: No follow-ups on file.   Fuller Plan, MD  Note - This record has been created using [employer].  Chart creation errors have been sought, but may not always  have been located. Such creation errors do not reflect on  the standard of medical care.

## 2023-03-09 ENCOUNTER — Encounter: Payer: Medicaid Other | Admitting: Internal Medicine

## 2023-04-25 ENCOUNTER — Other Ambulatory Visit: Payer: Self-pay

## 2023-04-25 ENCOUNTER — Emergency Department (HOSPITAL_COMMUNITY): Payer: Medicaid Other

## 2023-04-25 ENCOUNTER — Emergency Department (HOSPITAL_COMMUNITY)
Admission: EM | Admit: 2023-04-25 | Discharge: 2023-04-25 | Discharge status: Against Medical Advice | Payer: Medicaid Other | Attending: Student | Admitting: Student

## 2023-04-25 DIAGNOSIS — Z20822 Contact with and (suspected) exposure to covid-19: Secondary | ICD-10-CM | POA: Insufficient documentation

## 2023-04-25 DIAGNOSIS — Z5321 Procedure and treatment not carried out due to patient leaving prior to being seen by health care provider: Secondary | ICD-10-CM | POA: Diagnosis not present

## 2023-04-25 DIAGNOSIS — R42 Dizziness and giddiness: Secondary | ICD-10-CM | POA: Insufficient documentation

## 2023-04-25 DIAGNOSIS — R0602 Shortness of breath: Secondary | ICD-10-CM | POA: Insufficient documentation

## 2023-04-25 DIAGNOSIS — M7989 Other specified soft tissue disorders: Secondary | ICD-10-CM | POA: Insufficient documentation

## 2023-04-25 DIAGNOSIS — R0981 Nasal congestion: Secondary | ICD-10-CM | POA: Diagnosis not present

## 2023-04-25 DIAGNOSIS — R059 Cough, unspecified: Secondary | ICD-10-CM | POA: Diagnosis not present

## 2023-04-25 LAB — CBC WITH DIFFERENTIAL/PLATELET
Abs Immature Granulocytes: 0 10*3/uL (ref 0.00–0.07)
Basophils Absolute: 0 10*3/uL (ref 0.0–0.1)
Basophils Relative: 0 %
Eosinophils Absolute: 0.3 10*3/uL (ref 0.0–0.5)
Eosinophils Relative: 3 %
HCT: 32.9 % — ABNORMAL LOW (ref 36.0–46.0)
Hemoglobin: 9.5 g/dL — ABNORMAL LOW (ref 12.0–15.0)
Lymphocytes Relative: 18 %
Lymphs Abs: 1.5 10*3/uL (ref 0.7–4.0)
MCH: 23.4 pg — ABNORMAL LOW (ref 26.0–34.0)
MCHC: 28.9 g/dL — ABNORMAL LOW (ref 30.0–36.0)
MCV: 81 fL (ref 80.0–100.0)
Monocytes Absolute: 0.3 10*3/uL (ref 0.1–1.0)
Monocytes Relative: 4 %
Neutro Abs: 6.4 10*3/uL (ref 1.7–7.7)
Neutrophils Relative %: 75 %
Platelets: 344 10*3/uL (ref 150–400)
RBC: 4.06 MIL/uL (ref 3.87–5.11)
RDW: 21.8 % — ABNORMAL HIGH (ref 11.5–15.5)
WBC: 8.5 10*3/uL (ref 4.0–10.5)
nRBC: 0 % (ref 0.0–0.2)
nRBC: 0 /100{WBCs}

## 2023-04-25 LAB — RESP PANEL BY RT-PCR (RSV, FLU A&B, COVID)  RVPGX2
Influenza A by PCR: NEGATIVE
Influenza B by PCR: NEGATIVE
Resp Syncytial Virus by PCR: NEGATIVE
SARS Coronavirus 2 by RT PCR: NEGATIVE

## 2023-04-25 LAB — BRAIN NATRIURETIC PEPTIDE: B Natriuretic Peptide: 15.4 pg/mL (ref 0.0–100.0)

## 2023-04-25 LAB — COMPREHENSIVE METABOLIC PANEL
ALT: 16 U/L (ref 0–44)
AST: 20 U/L (ref 15–41)
Albumin: 2.8 g/dL — ABNORMAL LOW (ref 3.5–5.0)
Alkaline Phosphatase: 95 U/L (ref 38–126)
Anion gap: 8 (ref 5–15)
BUN: <5 mg/dL — ABNORMAL LOW (ref 6–20)
CO2: 27 mmol/L (ref 22–32)
Calcium: 8.7 mg/dL — ABNORMAL LOW (ref 8.9–10.3)
Chloride: 100 mmol/L (ref 98–111)
Creatinine, Ser: 0.54 mg/dL (ref 0.44–1.00)
GFR, Estimated: >60 mL/min (ref >60)
Glucose, Bld: 169 mg/dL — ABNORMAL HIGH (ref 70–99)
Potassium: 3.9 mmol/L (ref 3.5–5.1)
Sodium: 135 mmol/L (ref 135–145)
Total Bilirubin: 0.3 mg/dL (ref 0.0–1.2)
Total Protein: 8.8 g/dL — ABNORMAL HIGH (ref 6.5–8.1)

## 2023-04-25 NOTE — ED Provider Triage Note (Signed)
Emergency Medicine Provider Triage Evaluation Note  Nicole Hurley , a 38 y.o. female  was evaluated in triage.  Pt complains of shortness of breath.  States same began 4 days ago and has been persistent since then.  States last time she felt this way was back in June when she was found to have a hemoglobin of 6.9 and needed a blood transfusion.  She has been taking her iron supplement as prescribed but has not had her levels checked recently.  Also endorses cough and congestion.  Her legs have been swollen as well.  Review of Systems  Positive:  Negative:   Physical Exam  BP (!) 141/99   Pulse 97   Temp 97.9 F (36.6 C)   Resp (!) 24   Ht 5\' 6"  (1.676 m)   Wt (!) 172.4 kg   SpO2 100%   BMI 61.33 kg/m  Gen:   Awake, no distress   Resp:  Normal effort  MSK:   Moves extremities without difficulty  Other:    Medical Decision Making  Medically screening exam initiated at 2:03 PM.  Appropriate orders placed.  SAMARAH HOGLE was informed that the remainder of the evaluation will be completed by another provider, this initial triage assessment does not replace that evaluation, and the importance of remaining in the ED until their evaluation is complete.     Silva Bandy, PA-C 04/25/23 [phone]

## 2023-04-25 NOTE — ED Triage Notes (Signed)
PT complains of SOB, bilateral leg swelling and lightheadedness x 3 days. Denies any n/v/d. Denies fevers. Denies cough. SOB worse with walking

## 2023-04-25 NOTE — ED Notes (Signed)
Pt called X2 to go to a room. Pt could not be found.

## 2023-05-05 ENCOUNTER — Encounter: Payer: Medicaid Other | Admitting: Podiatry

## 2023-05-05 NOTE — Progress Notes (Signed)
Patient did not show for her scheduled appointment this morning.

## 2023-09-21 ENCOUNTER — Emergency Department (HOSPITAL_COMMUNITY)
Admission: EM | Admit: 2023-09-21 | Discharge: 2023-09-22 | Disposition: A | Discharge status: Home / Self Care | Attending: Emergency Medicine | Admitting: Emergency Medicine

## 2023-09-21 ENCOUNTER — Other Ambulatory Visit: Payer: Self-pay

## 2023-09-21 ENCOUNTER — Encounter (HOSPITAL_COMMUNITY): Payer: Self-pay | Admitting: Emergency Medicine

## 2023-09-21 DIAGNOSIS — H6691 Otitis media, unspecified, right ear: Secondary | ICD-10-CM | POA: Diagnosis not present

## 2023-09-21 DIAGNOSIS — H9201 Otalgia, right ear: Secondary | ICD-10-CM

## 2023-09-21 DIAGNOSIS — H6690 Otitis media, unspecified, unspecified ear: Secondary | ICD-10-CM

## 2023-09-21 MED ORDER — OXYCODONE-ACETAMINOPHEN 5-325 MG PO TABS
1.0000 | ORAL_TABLET | ORAL | Status: DC | PRN
  Administered 2023-09-21: 1 via ORAL
  Filled 2023-09-21: qty 1

## 2023-09-21 NOTE — ED Triage Notes (Signed)
 Patient c/o right ear pain since yesterday.  Patient denies radiation of pain.  Patient gives verbal consent for MSE.

## 2023-09-22 MED ORDER — IBUPROFEN 800 MG PO TABS
800.0000 mg | ORAL_TABLET | Freq: Once | ORAL | Status: AC
  Administered 2023-09-22: 800 mg via ORAL
  Filled 2023-09-22: qty 1

## 2023-09-22 MED ORDER — AMOXICILLIN 500 MG PO CAPS: 500.0000 mg | ORAL_CAPSULE | Freq: Two times a day (BID) | ORAL | 0 refills | Status: AC

## 2023-09-22 MED ORDER — KETOROLAC TROMETHAMINE 10 MG PO TABS: 10.0000 mg | ORAL_TABLET | Freq: Four times a day (QID) | ORAL | 0 refills | Status: DC | PRN

## 2023-09-22 NOTE — ED Provider Notes (Signed)
 [place] EMERGENCY DEPARTMENT AT [facility] Provider Note   CSN: [id number] Arrival date & time: 09/21/23  2147     Patient presents with: Otalgia   Nicole Hurley is a 38 y.o. female.    Otalgia    38 year old female presenting to the emerged part with chief complaint of otalgia.  The patient states that she has had roughly 1 day of ear pain on the right.  She denies any hearing loss.  No discharge.  No fevers.  Prior to Admission medications   Medication Sig Start Date End Date Taking? Authorizing Provider  amoxicillin  (AMOXIL ) 500 MG capsule Take 1 capsule (500 mg total) by mouth 2 (two) times daily for 5 days. 09/22/23 09/27/23 Yes Jerrol Agent, MD  ketorolac  (TORADOL ) 10 MG tablet Take 1 tablet (10 mg total) by mouth every 6 (six) hours as needed. 09/22/23  Yes Jerrol Agent, MD  acetaminophen  (TYLENOL ) 500 MG tablet Take 500 mg by mouth every 6 (six) hours as needed for moderate pain.    [provider]  albuterol  (VENTOLIN  HFA) 108 (90 Base) MCG/ACT inhaler Inhale 1-2 puffs into the lungs every 6 (six) hours as needed for wheezing or shortness of breath. 09/13/22   Trixie Nilda HERO, MD  ferrous sulfate  325 (65 FE) MG tablet Take 1 tablet (325 mg total) by mouth every other day. 10/03/22 10/03/23  Danford, Lonni SQUIBB, MD    Allergies: Benadryl [diphenhydramine hcl (sleep)]    Review of Systems  HENT:  Positive for ear pain.   All other systems reviewed and are negative.   Updated Vital Signs BP 135/74 (BP Location: Right Arm)   Pulse 84   Temp 98.2 F (36.8 C) (Oral)   Resp 16   Wt (!) 172.4 kg   SpO2 97%   BMI 61.33 kg/m   Physical Exam Vitals and nursing note reviewed.  Constitutional:      General: She is not in acute distress. HENT:     Head: Normocephalic and atraumatic.     Left Ear: Tympanic membrane, ear canal and external ear normal.     Ears:     Comments: TM on the right slightly bulging, appears to be thin hair resting  against the tympanic membrane, no evidence of cerumen in the external auditory canal and no evidence of otitis externa  Eyes:     Conjunctiva/sclera: Conjunctivae normal.     Pupils: Pupils are equal, round, and reactive to light.    Cardiovascular:     Rate and Rhythm: Normal rate and regular rhythm.  Pulmonary:     Effort: Pulmonary effort is normal. No respiratory distress.  Abdominal:     General: There is no distension.     Tenderness: There is no guarding.   Musculoskeletal:        General: No deformity or signs of injury.     Cervical back: Neck supple.   Skin:    Findings: No lesion or rash.   Neurological:     General: No focal deficit present.     Mental Status: She is alert. Mental status is at baseline.     (all labs ordered are listed, but only abnormal results are displayed) Labs Reviewed - No data to display  EKG: None  Radiology: No results found.   Procedures   Medications Ordered in the ED  oxyCODONE -acetaminophen  (PERCOCET/ROXICET) 5-325 MG per tablet 1 tablet (1 tablet Oral Given 09/21/23 2311)  ibuprofen  (ADVIL ) tablet 800 mg (800 mg Oral  Given 09/22/23 0415)                                    Medical Decision Making Risk Prescription drug management.    38 year old female presenting to the emerged part with chief complaint of otalgia.  The patient states that she has had roughly 1 day of ear pain on the right.  She denies any hearing loss.  No discharge.  No fevers.  On arrival, the patient was vitally stable.  On exam, the patient had a mildly bulging TM, no rupture, possible hear resting against the TM.  The patient's ear was irrigated by nursing with no improvement, suspect likely otitis media as the etiology of the patient's presentation.  Motrin  provided for pain control.  Will discharge on oral Toradol  for pain control and provide amoxicillin  for likely developing otitis media.  Stable for discharge and outpatient follow-up.      Final diagnoses:  Right ear pain  Acute otitis media, unspecified otitis media type    ED Discharge Orders          Ordered    ketorolac  (TORADOL ) 10 MG tablet  Every 6 hours PRN        09/22/23 0430    amoxicillin  (AMOXIL ) 500 MG capsule  2 times daily        09/22/23 0437               Jerrol Agent, MD 09/22/23 [phone]

## 2023-10-05 ENCOUNTER — Emergency Department (HOSPITAL_COMMUNITY)
Admission: EM | Admit: 2023-10-05 | Discharge: 2023-10-05 | Disposition: A | Discharge status: Home / Self Care | Attending: Emergency Medicine | Admitting: Emergency Medicine

## 2023-10-05 ENCOUNTER — Other Ambulatory Visit: Payer: Self-pay

## 2023-10-05 DIAGNOSIS — H9201 Otalgia, right ear: Secondary | ICD-10-CM | POA: Insufficient documentation

## 2023-10-05 NOTE — Discharge Instructions (Signed)
 As we discussed, your ear exam is normal today.  You likely have some ear canal irritation from going to the pool.  Please avoid going to the pool until your ear pain resolved.  You may take Motrin  800 mg every 6 hours as needed for pain  Follow-up with your doctor  Return to ER if you have severe ear pain or fever or purulent drainage

## 2023-10-05 NOTE — ED Provider Notes (Signed)
 [place] EMERGENCY DEPARTMENT AT [facility] Provider Note   CSN: [id number] Arrival date & time: 10/05/23  2200     Patient presents with: Otalgia   Nicole Hurley is a 38 y.o. female here presenting with right ear pain.  Patient was seen at [facility] 2 weeks ago and was noted to have cerumen impaction.  Her ear was irrigated and patient was not sent home with any drops or antibiotics.  Patient has been going to the pool with her kids.  She noticed some right ear pain.  Patient denies any fever or purulent drainage   The history is provided by the patient.       Prior to Admission medications   Medication Sig Start Date End Date Taking? Authorizing Provider  acetaminophen  (TYLENOL ) 500 MG tablet Take 500 mg by mouth every 6 (six) hours as needed for moderate pain.    [provider]  albuterol  (VENTOLIN  HFA) 108 (90 Base) MCG/ACT inhaler Inhale 1-2 puffs into the lungs every 6 (six) hours as needed for wheezing or shortness of breath. 09/13/22   Trixie Nilda HERO, MD  ferrous sulfate  325 (65 FE) MG tablet Take 1 tablet (325 mg total) by mouth every other day. 10/03/22 10/03/23  Danford, Lonni SQUIBB, MD  ketorolac  (TORADOL ) 10 MG tablet Take 1 tablet (10 mg total) by mouth every 6 (six) hours as needed. 09/22/23   Jerrol Agent, MD    Allergies: Benadryl [diphenhydramine hcl (sleep)]    Review of Systems  HENT:  Positive for ear pain.   All other systems reviewed and are negative.   Updated Vital Signs BP (!) 171/72 (BP Location: Left Arm)   Pulse 100   Temp 98.5 F (36.9 C) (Oral)   Resp 20   SpO2 97%   Physical Exam Vitals and nursing note reviewed.  Constitutional:      Appearance: Normal appearance.  HENT:     Head: Normocephalic.     Right Ear: Tympanic membrane normal.     Left Ear: Tympanic membrane normal.     Ears:     Comments: No obvious otitis externa and no obvious cerumen impaction.  Patient has no otitis media bilaterally     Mouth/Throat:     Mouth: Mucous membranes are moist.  Eyes:     Extraocular Movements: Extraocular movements intact.     Pupils: Pupils are equal, round, and reactive to light.  Cardiovascular:     Rate and Rhythm: Normal rate and regular rhythm.     Pulses: Normal pulses.     Heart sounds: Normal heart sounds.  Pulmonary:     Effort: Pulmonary effort is normal.     Breath sounds: Normal breath sounds.  Abdominal:     General: Abdomen is flat.     Palpations: Abdomen is soft.  Musculoskeletal:        General: Normal range of motion.     Cervical back: Normal range of motion and neck supple.  Skin:    General: Skin is warm.     Capillary Refill: Capillary refill takes less than 2 seconds.  Neurological:     General: No focal deficit present.     Mental Status: She is alert and oriented to person, place, and time.  Psychiatric:        Mood and Affect: Mood normal.        Behavior: Behavior normal.     (all labs ordered are listed, but only abnormal results are displayed)  Labs Reviewed - No data to display  EKG: None  Radiology: No results found.   Procedures   Medications Ordered in the ED - No data to display                                  Medical Decision Making Nicole Hurley is a 38 y.o. female here presenting with right ear pain.  Ear exam is unremarkable.  Patient has been going to the pool and I think has some irritation of the canal.  I told her to avoid going to the pool until her ear pain resolved.  Recommend Motrin  as needed for pain.  Stable for discharge   Problems Addressed: Right ear pain: acute illness or injury     Final diagnoses:  Right ear pain    ED Discharge Orders     None          Patt Alm Macho, MD 10/05/23 2247

## 2023-10-05 NOTE — ED Triage Notes (Signed)
 Patient c/o right ear pain since 2 weeks ago when she went to [place] and it has been on and off irritating her. No drainage reported or loss of hearing.

## 2023-11-14 ENCOUNTER — Encounter (HOSPITAL_COMMUNITY): Payer: Self-pay

## 2023-11-14 ENCOUNTER — Ambulatory Visit (INDEPENDENT_AMBULATORY_CARE_PROVIDER_SITE_OTHER)

## 2023-11-14 ENCOUNTER — Ambulatory Visit (HOSPITAL_COMMUNITY)
Admission: EM | Admit: 2023-11-14 | Discharge: 2023-11-14 | Disposition: A | Discharge status: Home / Self Care | Attending: Physician Assistant | Admitting: Physician Assistant

## 2023-11-14 DIAGNOSIS — M25572 Pain in left ankle and joints of left foot: Secondary | ICD-10-CM

## 2023-11-14 NOTE — Discharge Instructions (Signed)
 Wear ankle brace as needed. Recommend Tylenol  as needed.  Recommend elevation. If no improvement recommend follow-up with orthopedics.

## 2023-11-14 NOTE — ED Provider Notes (Signed)
 [facility]    CSN: [id number] Arrival date & time: 11/14/23  1444      History   Chief Complaint Chief Complaint  Patient presents with   Ankle Injury    HPI Nicole Hurley is a 38 y.o. female.   Patient presents with left ankle pain that started 2 days ago.  She reports she slipped on water and felt her ankle turned and then rolled the ankle again when she got stuck in a fence last night.  She reports inversion.  She reports pain is worse with weightbearing.  She has been taking Tylenol  with temporary relief.  No previous injuries to this ankle.    Past Medical History:  Diagnosis Date   Gallstones 09/2017   Hypertension     Patient Active Problem List   Diagnosis Date Noted   Acute respiratory failure with hypoxia (HCC) 10/02/2022   Left adrenal mass (HCC) 10/02/2022   Axillary adenopathy 10/02/2022   Chest pain 10/02/2022   Iron deficiency anemia due to chronic blood loss 09/12/2022   Cholecystitis 10/21/2017    Past Surgical History:  Procedure Laterality Date   CESAREAN SECTION     CHOLECYSTECTOMY N/A 10/22/2017   Procedure: LAPAROSCOPIC CHOLECYSTECTOMY WITH POSSIBLE INTRAOPERATIVE CHOLANGIOGRAM;  Surgeon: Vanderbilt Ned, MD;  Location: [facility] OR;  Service: General;  Laterality: N/A;   DENTAL SURGERY      OB History   No obstetric history on file.      Home Medications    Prior to Admission medications   Medication Sig Start Date End Date Taking? Authorizing Provider  acetaminophen  (TYLENOL ) 500 MG tablet Take 500 mg by mouth every 6 (six) hours as needed for moderate pain.    [provider]  albuterol  (VENTOLIN  HFA) 108 (90 Base) MCG/ACT inhaler Inhale 1-2 puffs into the lungs every 6 (six) hours as needed for wheezing or shortness of breath. 09/13/22   Trixie Nilda HERO, MD  ferrous sulfate  325 (65 FE) MG tablet Take 1 tablet (325 mg total) by mouth every other day. Patient not taking: Reported on 11/14/2023 10/03/22 10/03/23   Jonel Lonni SQUIBB, MD  ketorolac  (TORADOL ) 10 MG tablet Take 1 tablet (10 mg total) by mouth every 6 (six) hours as needed. Patient not taking: Reported on 11/14/2023 09/22/23   Jerrol Agent, MD    Family History Family History  Problem Relation Age of Onset   Healthy Mother    Healthy Father     Social History Social History   Tobacco Use   Smoking status: Former    Types: Cigarettes   Smokeless tobacco: Never  Vaping Use   Vaping status: Never Used  Substance Use Topics   Alcohol use: Yes   Drug use: No     Allergies   Benadryl [diphenhydramine hcl (sleep)]   Review of Systems Review of Systems  Constitutional:  Negative for chills and fever.  HENT:  Negative for ear pain and sore throat.   Eyes:  Negative for pain and visual disturbance.  Respiratory:  Negative for cough and shortness of breath.   Cardiovascular:  Negative for chest pain and palpitations.  Gastrointestinal:  Negative for abdominal pain and vomiting.  Genitourinary:  Negative for dysuria and hematuria.  Musculoskeletal:  Positive for arthralgias (ankle pain). Negative for back pain.  Skin:  Negative for color change and rash.  Neurological:  Negative for seizures and syncope.  All other systems reviewed and are negative.    Physical Exam Triage Vital Signs ED  Triage Vitals [11/14/23 1639]  Encounter Vitals Group     BP (!) 153/92     Girls Systolic BP Percentile      Girls Diastolic BP Percentile      Boys Systolic BP Percentile      Boys Diastolic BP Percentile      Pulse Rate 98     Resp 18     Temp 98.1 F (36.7 C)     Temp Source Oral     SpO2 98 %     Weight      Height      Head Circumference      Peak Flow      Pain Score 9     Pain Loc      Pain Education      Exclude from Growth Chart    No data found.  Updated Vital Signs BP (!) 153/92 (BP Location: Left Arm)   Pulse 98   Temp 98.1 F (36.7 C) (Oral)   Resp 18   LMP 10/23/2023 (Approximate)   SpO2 98%    Visual Acuity Right Eye Distance:   Left Eye Distance:   Bilateral Distance:    Right Eye Near:   Left Eye Near:    Bilateral Near:     Physical Exam Vitals and nursing note reviewed.  Constitutional:      General: She is not in acute distress.    Appearance: She is well-developed.  HENT:     Head: Normocephalic and atraumatic.  Eyes:     Conjunctiva/sclera: Conjunctivae normal.  Cardiovascular:     Rate and Rhythm: Normal rate and regular rhythm.     Heart sounds: No murmur heard. Pulmonary:     Effort: Pulmonary effort is normal. No respiratory distress.     Breath sounds: Normal breath sounds.  Abdominal:     Palpations: Abdomen is soft.     Tenderness: There is no abdominal tenderness.  Musculoskeletal:        General: No swelling.     Cervical back: Neck supple.     Left ankle: No swelling or deformity. Tenderness present over the ATF ligament.  Skin:    General: Skin is warm and dry.     Capillary Refill: Capillary refill takes less than 2 seconds.  Neurological:     Mental Status: She is alert.  Psychiatric:        Mood and Affect: Mood normal.      UC Treatments / Results  Labs (all labs ordered are listed, but only abnormal results are displayed) Labs Reviewed - No data to display  EKG   Radiology DG Ankle Complete Left Result Date: 11/14/2023 CLINICAL DATA:  Left ankle injury and pain. EXAM: LEFT ANKLE COMPLETE - 3+ VIEW COMPARISON:  None Available. FINDINGS: There is no evidence of fracture, dislocation, or joint effusion. Mild degenerative joint disease is seen. Prominent plantar calcaneal bone spur also noted. IMPRESSION: No acute findings. Mild degenerative joint disease. Prominent plantar calcaneal bone spur. Electronically Signed   By: Norleen DELENA Kil M.D.   On: 11/14/2023 17:11    Procedures Procedures (including critical care time)  Medications Ordered in UC Medications - No data to display  Initial Impression / Assessment and Plan / UC  Course  I have reviewed the triage vital signs and the nursing notes.  Pertinent labs & imaging results that were available during my care of the patient were reviewed by me and considered in my medical decision making (see chart  for details).     Left ankle pain, possible ankle sprain.  Imaging negative for fracture.  Ace bandage applied.  Advised ice, elevation, anti-inflammatories.  If no improvement recommend follow-up with orthopedics. Final Clinical Impressions(s) / UC Diagnoses   Final diagnoses:  Acute left ankle pain     Discharge Instructions      Wear ankle brace as needed. Recommend Tylenol  as needed.  Recommend elevation. If no improvement recommend follow-up with orthopedics.   ED Prescriptions   None    PDMP not reviewed this encounter.   Ward, Harlene PEDLAR, PA-C 11/14/23 [phone]

## 2023-11-14 NOTE — ED Triage Notes (Signed)
 Patient reports that she slipped on water and injured her left ankle 2 days ago and yesterday she was locked into the pool area at her apartment and had to climb a fence and rolled the left ankle.  Patient states she has taken Tylenol , Ibuprofen , and Nyquil to help her sleep due to the pain.

## 2024-03-03 ENCOUNTER — Ambulatory Visit (HOSPITAL_COMMUNITY)
Admission: EM | Admit: 2024-03-03 | Discharge: 2024-03-03 | Disposition: A | Discharge status: Home / Self Care | Attending: Family Medicine | Admitting: Family Medicine

## 2024-03-03 ENCOUNTER — Encounter (HOSPITAL_COMMUNITY): Payer: Self-pay

## 2024-03-03 DIAGNOSIS — H6011 Cellulitis of right external ear: Secondary | ICD-10-CM

## 2024-03-03 DIAGNOSIS — R070 Pain in throat: Secondary | ICD-10-CM | POA: Diagnosis not present

## 2024-03-03 DIAGNOSIS — H9201 Otalgia, right ear: Secondary | ICD-10-CM

## 2024-03-03 LAB — POCT RAPID STREP A (OFFICE): Rapid Strep A Screen: NEGATIVE

## 2024-03-03 MED ORDER — CEPHALEXIN 500 MG PO CAPS: 500.0000 mg | ORAL_CAPSULE | Freq: Two times a day (BID) | ORAL | 0 refills | Status: AC

## 2024-03-03 MED ORDER — IBUPROFEN 800 MG PO TABS: 800.0000 mg | ORAL_TABLET | Freq: Three times a day (TID) | ORAL | 0 refills | Status: AC | PRN

## 2024-03-03 NOTE — ED Triage Notes (Signed)
 Pt c/o rt ear pain x2 days. Denies drainage. Denies taken any meds.

## 2024-03-03 NOTE — Discharge Instructions (Signed)
 The rapid strep test is negative  Cephalexin  500 mg --1 tablet by mouth 2 times daily for 7 days.  Take ibuprofen  800 mg--1 tab every 8 hours as needed for pain.

## 2024-03-03 NOTE — ED Provider Notes (Addendum)
 [facility]    CSN: [id number] Arrival date & time: 03/03/24  1753      History   Chief Complaint Chief Complaint  Patient presents with   Otalgia    HPI Nicole Hurley is a 38 y.o. female.    Otalgia  Here for pain in her right ear that has been throbbing for the last 2 days.  No fever or chills and no cough or congestion.  She has had some sore throat however.   No drainage from the ear and not really itching.  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      Home Medications    Prior to Admission medications  Medication Sig Start Date End Date Taking? Authorizing Provider  cephALEXin  (KEFLEX ) 500 MG capsule Take 1 capsule (500 mg total) by mouth 2 (two) times daily for 7 days. 03/03/24 03/10/24 Yes Vonna Sharlet POUR, MD  ibuprofen  (ADVIL ) 800 MG tablet Take 1 tablet (800 mg total) by mouth every 8 (eight) hours as needed (pain). 03/03/24  Yes Vonna Sharlet POUR, MD  acetaminophen  (TYLENOL ) 500 MG tablet Take 500 mg by mouth every 6 (six) hours as needed for moderate pain.    [provider]  albuterol  (VENTOLIN  HFA) 108 (90 Base) MCG/ACT inhaler Inhale 1-2 puffs into the lungs every 6 (six) hours as  needed for wheezing or shortness of breath. 09/13/22   Trixie Nilda HERO, MD  ferrous sulfate  325 (65 FE) MG tablet Take 1 tablet (325 mg total) by mouth every other day. Patient not taking: Reported on 11/14/2023 10/03/22 10/03/23  Jonel Lonni SQUIBB, MD    Family History Family History  Problem Relation Age of Onset   Healthy Mother    Healthy Father     Social History Social History[1]   Allergies   Benadryl [diphenhydramine hcl (sleep)]   Review of Systems Review of Systems  HENT:  Positive for ear pain.      Physical Exam Triage Vital Signs ED Triage Vitals  Encounter Vitals Group     BP 03/03/24 1824 (!) 141/65     Girls Systolic BP Percentile --      Girls Diastolic BP Percentile --      Boys Systolic BP Percentile --      Boys Diastolic BP Percentile --      Pulse Rate 03/03/24 1824 87     Resp 03/03/24 1824 18     Temp 03/03/24 1824 97.8 F (36.6 C)     Temp Source 03/03/24 1824 Oral     SpO2 03/03/24 1824 97 %     Weight --      Height --      Head Circumference --  Peak Flow --      Pain Score 03/03/24 1823 7     Pain Loc --      Pain Education --      Exclude from Growth Chart --    No data found.  Updated Vital Signs BP (!) 141/65 (BP Location: Right Arm)   Pulse 87   Temp 97.8 F (36.6 C) (Oral)   Resp 18   LMP 02/21/2024 (Exact Date)   SpO2 97%   Visual Acuity Right Eye Distance:   Left Eye Distance:   Bilateral Distance:    Right Eye Near:   Left Eye Near:    Bilateral Near:     Physical Exam Vitals reviewed.  Constitutional:      General: She is not in acute distress.    Appearance: She is not toxic-appearing.  HENT:     Right Ear: Tympanic membrane and ear canal normal.     Left Ear: Tympanic membrane and ear canal normal.     Ears:     Comments: There is pain on palpation of the right tragus.  There is also a little tenderness in the tissues anterior to the tragus.    Nose: Nose normal.     Mouth/Throat:      Mouth: Mucous membranes are moist.     Comments: Tonsils are 3+ hypertrophied and mildly erythematous.  The right 1 might be a little larger than the left.  No white exudate.  No peritonsillar swelling Eyes:     Extraocular Movements: Extraocular movements intact.     Conjunctiva/sclera: Conjunctivae normal.     Pupils: Pupils are equal, round, and reactive to light.  Cardiovascular:     Rate and Rhythm: Normal rate and regular rhythm.     Heart sounds: No murmur heard. Pulmonary:     Effort: Pulmonary effort is normal. No respiratory distress.     Breath sounds: No stridor. No wheezing, rhonchi or rales.  Musculoskeletal:     Cervical back: Neck supple.  Lymphadenopathy:     Cervical: No cervical adenopathy.  Skin:    Capillary Refill: Capillary refill takes less than 2 seconds.     Coloration: Skin is not jaundiced or pale.  Neurological:     General: No focal deficit present.     Mental Status: She is alert and oriented to person, place, and time.  Psychiatric:        Behavior: Behavior normal.      UC Treatments / Results  Labs (all labs ordered are listed, but only abnormal results are displayed) Labs Reviewed  POCT RAPID STREP A (OFFICE)    EKG   Radiology No results found.  Procedures Procedures (including critical care time)  Medications Ordered in UC Medications - No data to display  Initial Impression / Assessment and Plan / UC Course  I have reviewed the triage vital signs and the nursing notes.  Pertinent labs & imaging results that were available during my care of the patient were reviewed by me and considered in my medical decision making (see chart for details).     Rapid strep test is negative.  Since I am going to treat for possible cellulitis of the external ear with Keflex , I am not going to do a throat culture.  Ibuprofen  is sent in for pain Final Clinical Impressions(s) / UC Diagnoses   Final diagnoses:  Right ear pain  Throat pain   Cellulitis of right external ear     Discharge Instructions  The rapid strep test is negative  Cephalexin  500 mg --1 tablet by mouth 2 times daily for 7 days.  Take ibuprofen  800 mg--1 tab every 8 hours as needed for pain.       ED Prescriptions     Medication Sig Dispense Auth. Provider   cephALEXin  (KEFLEX ) 500 MG capsule Take 1 capsule (500 mg total) by mouth 2 (two) times daily for 7 days. 14 capsule Idrissa Beville K, MD   ibuprofen  (ADVIL ) 800 MG tablet Take 1 tablet (800 mg total) by mouth every 8 (eight) hours as needed (pain). 21 tablet Latrecia Capito K, MD      PDMP not reviewed this encounter.    Vonna Sharlet POUR, MD 03/03/24 1947     [1]  Social History Tobacco Use   Smoking status: Former    Types: Cigarettes   Smokeless tobacco: Never  Vaping Use   Vaping status: Never Used  Substance Use Topics   Alcohol use: Yes   Drug use: No     Vonna Sharlet POUR, MD 03/03/24 2053
